# Patient Record
Sex: Male | Born: 1945 | Race: White | Hispanic: No | Marital: Married | Smoking: Former smoker
Health system: Southern US, Community
[De-identification: ages and names within clinical notes are randomized; demographics above are authoritative.]

---

## 2018-09-19 ENCOUNTER — Other Ambulatory Visit: Payer: Self-pay

## 2018-09-19 ENCOUNTER — Inpatient Hospital Stay (HOSPITAL_COMMUNITY)
Admission: AD | Admit: 2018-09-19 | Discharge: 2018-09-25 | DRG: 603 | Disposition: A | Discharge status: Home Health | Payer: Medicare Other | Source: Other Acute Inpatient Hospital | Attending: Internal Medicine | Admitting: Internal Medicine

## 2018-09-19 DIAGNOSIS — L03115 Cellulitis of right lower limb: Secondary | ICD-10-CM | POA: Diagnosis present

## 2018-09-19 DIAGNOSIS — F419 Anxiety disorder, unspecified: Secondary | ICD-10-CM | POA: Diagnosis present

## 2018-09-19 DIAGNOSIS — E876 Hypokalemia: Secondary | ICD-10-CM | POA: Diagnosis not present

## 2018-09-19 DIAGNOSIS — J449 Chronic obstructive pulmonary disease, unspecified: Secondary | ICD-10-CM | POA: Diagnosis present

## 2018-09-19 DIAGNOSIS — F329 Major depressive disorder, single episode, unspecified: Secondary | ICD-10-CM | POA: Diagnosis present

## 2018-09-19 DIAGNOSIS — D631 Anemia in chronic kidney disease: Secondary | ICD-10-CM | POA: Diagnosis present

## 2018-09-19 DIAGNOSIS — I10 Essential (primary) hypertension: Secondary | ICD-10-CM | POA: Diagnosis present

## 2018-09-19 DIAGNOSIS — R0602 Shortness of breath: Secondary | ICD-10-CM

## 2018-09-19 DIAGNOSIS — I482 Chronic atrial fibrillation, unspecified: Secondary | ICD-10-CM | POA: Diagnosis present

## 2018-09-19 DIAGNOSIS — R52 Pain, unspecified: Secondary | ICD-10-CM | POA: Diagnosis not present

## 2018-09-19 DIAGNOSIS — E1122 Type 2 diabetes mellitus with diabetic chronic kidney disease: Secondary | ICD-10-CM | POA: Diagnosis present

## 2018-09-19 DIAGNOSIS — Z87891 Personal history of nicotine dependence: Secondary | ICD-10-CM | POA: Diagnosis not present

## 2018-09-19 DIAGNOSIS — M25571 Pain in right ankle and joints of right foot: Secondary | ICD-10-CM

## 2018-09-19 DIAGNOSIS — Z7901 Long term (current) use of anticoagulants: Secondary | ICD-10-CM

## 2018-09-19 DIAGNOSIS — K219 Gastro-esophageal reflux disease without esophagitis: Secondary | ICD-10-CM | POA: Diagnosis present

## 2018-09-19 DIAGNOSIS — E1169 Type 2 diabetes mellitus with other specified complication: Secondary | ICD-10-CM | POA: Diagnosis not present

## 2018-09-19 DIAGNOSIS — M109 Gout, unspecified: Secondary | ICD-10-CM | POA: Diagnosis present

## 2018-09-19 DIAGNOSIS — M7989 Other specified soft tissue disorders: Secondary | ICD-10-CM | POA: Diagnosis not present

## 2018-09-19 DIAGNOSIS — M25471 Effusion, right ankle: Secondary | ICD-10-CM | POA: Diagnosis not present

## 2018-09-19 DIAGNOSIS — E039 Hypothyroidism, unspecified: Secondary | ICD-10-CM | POA: Diagnosis present

## 2018-09-19 DIAGNOSIS — Z6834 Body mass index (BMI) 34.0-34.9, adult: Secondary | ICD-10-CM

## 2018-09-19 DIAGNOSIS — E1140 Type 2 diabetes mellitus with diabetic neuropathy, unspecified: Secondary | ICD-10-CM | POA: Diagnosis present

## 2018-09-19 DIAGNOSIS — M79604 Pain in right leg: Secondary | ICD-10-CM

## 2018-09-19 DIAGNOSIS — N179 Acute kidney failure, unspecified: Secondary | ICD-10-CM | POA: Diagnosis present

## 2018-09-19 DIAGNOSIS — I2510 Atherosclerotic heart disease of native coronary artery without angina pectoris: Secondary | ICD-10-CM | POA: Diagnosis present

## 2018-09-19 DIAGNOSIS — I129 Hypertensive chronic kidney disease with stage 1 through stage 4 chronic kidney disease, or unspecified chronic kidney disease: Secondary | ICD-10-CM | POA: Diagnosis present

## 2018-09-19 DIAGNOSIS — M25579 Pain in unspecified ankle and joints of unspecified foot: Secondary | ICD-10-CM

## 2018-09-19 DIAGNOSIS — N183 Chronic kidney disease, stage 3 unspecified: Secondary | ICD-10-CM | POA: Diagnosis present

## 2018-09-19 DIAGNOSIS — Z20828 Contact with and (suspected) exposure to other viral communicable diseases: Secondary | ICD-10-CM | POA: Diagnosis present

## 2018-09-19 DIAGNOSIS — E119 Type 2 diabetes mellitus without complications: Secondary | ICD-10-CM

## 2018-09-19 DIAGNOSIS — M79671 Pain in right foot: Secondary | ICD-10-CM | POA: Diagnosis present

## 2018-09-19 MED ORDER — SODIUM CHLORIDE 0.9 % IV SOLN
INTRAVENOUS | Status: DC
  Administered 2018-09-20 – 2018-09-22 (×3): via INTRAVENOUS

## 2018-09-19 MED ORDER — HEPARIN SODIUM (PORCINE) 5000 UNIT/ML IJ SOLN
5000.0000 [IU] | Freq: Three times a day (TID) | INTRAMUSCULAR | Status: DC
  Administered 2018-09-20 – 2018-09-21 (×4): 5000 [IU] via SUBCUTANEOUS
  Filled 2018-09-19 (×4): qty 1

## 2018-09-19 MED ORDER — MORPHINE SULFATE (PF) 2 MG/ML IV SOLN
2.0000 mg | INTRAVENOUS | Status: DC | PRN
  Administered 2018-09-20 – 2018-09-22 (×6): 2 mg via INTRAVENOUS
  Filled 2018-09-19 (×6): qty 1

## 2018-09-19 MED ORDER — ONDANSETRON HCL 4 MG PO TABS: 4.0000 mg | ORAL_TABLET | Freq: Four times a day (QID) | ORAL | Status: DC | PRN

## 2018-09-19 MED ORDER — PIPERACILLIN-TAZOBACTAM 3.375 G IVPB 30 MIN
3.3750 g | Freq: Once | INTRAVENOUS | Status: AC
  Administered 2018-09-20: 3.375 g via INTRAVENOUS
  Filled 2018-09-19: qty 50

## 2018-09-19 MED ORDER — ONDANSETRON HCL 4 MG/2ML IJ SOLN: 4.0000 mg | Freq: Four times a day (QID) | INTRAMUSCULAR | Status: DC | PRN

## 2018-09-19 NOTE — H&P (Signed)
History and Physical   Marc Owen FKC:[id number] DOB: [birth year] DOA: 09/19/2018  Referring MD/NP/PA: Benson Norway  PCP: Patient, No Pcp Per   Outpatient Specialists: None  Patient coming from: [facility] [place]  Chief Complaint: Right foot swelling and pain  HPI: Marc Owen is a 73 y.o. male with medical history significant of type 2 diabetes, COPD, atrial fibrillation, coronary artery disease, morbid obesity who presented to the ER in [place] with complaint of right ankle pain swelling and tenderness.  He was suspected to have cellulitis and possibly septic arthritis of the right ankle joint.  He was hemodynamically is stable except for significant tenderness swelling of lab changes showing white count of 11.1 elevated ESR as well as x-ray showing nose evidence of osteomyelitis.  Due to lack of orthopedic surgeon in the hospital patient was transferred here so orthopedics can evaluate patient.  Dr. [name] from [facility] has accepted to see the patient.  Patient denied any fever or chills at the moment.  He has been unable to put weight on his feet.  He has been taking his medications for blood sugar as well as hypertension.  He has been accepted and will be admitted for medical service..  ED Course: In the outside ED temperature was 99 blood pressure 140/90 pulse 94 respirate 20 oxygen sat 94% room air.  His white count is 11.1 ESR 58 creatinine 2.12, right ankle films showed erosions but no osteomyelitis.  Review of Systems: As per HPI otherwise 10 point review of systems negative.    History reviewed. No pertinent past medical history.  History reviewed. No pertinent surgical history.   has no history on file for tobacco, alcohol, and drug.  Not on File  History reviewed. No pertinent family history.   Prior to Admission medications   Not on File    Physical Exam: Vitals:   09/20/18 0041 09/20/18 0057  BP: (!) 135/98   Pulse: 66 76  Resp:  20  Temp: 98.2 F  (36.8 C)   TempSrc: Oral   SpO2:  99%  Weight: 99.3 kg   Height: '5\' 7"'  (1.702 m)       Constitutional: NAD, mild distress due to pain, morbidly obese comfortable Vitals:   09/20/18 0041 09/20/18 0057  BP: (!) 135/98   Pulse: 66 76  Resp:  20  Temp: 98.2 F (36.8 C)   TempSrc: Oral   SpO2:  99%  Weight: 99.3 kg   Height: '5\' 7"'  (1.702 m)    Eyes: PERRL, lids and conjunctivae normal ENMT: Mucous membranes are moist. Posterior pharynx clear of any exudate or lesions.Normal dentition.  Neck: normal, supple, no masses, no thyromegaly Respiratory: clear to auscultation bilaterally, no wheezing, no crackles. Normal respiratory effort. No accessory muscle use.  Cardiovascular: Regular rate and rhythm, no murmurs / rubs / gallops. No extremity edema. 2+ pedal pulses. No carotid bruits.  Abdomen: no tenderness, no masses palpated. No hepatosplenomegaly. Bowel sounds positive.  Musculoskeletal: Right lower extremity is swollen but more around the ankle joint, warm to touch and very tender, no color change, decreased range of motion, normal muscle tone.  Skin: no rashes, lesions, ulcers. No induration Neurologic: CN 2-12 grossly intact. Sensation intact, DTR normal. Strength 5/5 in all 4.  Psychiatric: Normal judgment and insight. Alert and oriented x 3. Normal mood.     Labs on Admission: I have personally reviewed following labs and imaging studies  CBC: Recent Labs  Lab 09/20/18 0024  WBC 10.6*  HGB 12.7*  HCT 39.0  MCV 96.3  PLT 962   Basic Metabolic Panel: Recent Labs  Lab 09/20/18 0024  NA 139  K 3.5  CL 105  CO2 28  GLUCOSE 134*  BUN 25*  CREATININE 2.20*  CALCIUM 9.5   GFR: Estimated Creatinine Clearance: 34.1 mL/min (A) (by C-G formula based on SCr of 2.2 mg/dL (H)). Liver Function Tests: Recent Labs  Lab 09/20/18 0024  AST 13*  ALT 14  ALKPHOS 97  BILITOT 0.8  PROT 7.4  ALBUMIN 3.6   No results for input(s): LIPASE, AMYLASE in the last 168 hours.  No results for input(s): AMMONIA in the last 168 hours. Coagulation Profile: No results for input(s): INR, PROTIME in the last 168 hours. Cardiac Enzymes: No results for input(s): CKTOTAL, CKMB, CKMBINDEX, TROPONINI in the last 168 hours. BNP (last 3 results) No results for input(s): PROBNP in the last 8760 hours. HbA1C: No results for input(s): HGBA1C in the last 72 hours. CBG: No results for input(s): GLUCAP in the last 168 hours. Lipid Profile: No results for input(s): CHOL, HDL, LDLCALC, TRIG, CHOLHDL, LDLDIRECT in the last 72 hours. Thyroid Function Tests: No results for input(s): TSH, T4TOTAL, FREET4, T3FREE, THYROIDAB in the last 72 hours. Anemia Panel: No results for input(s): VITAMINB12, FOLATE, FERRITIN, TIBC, IRON, RETICCTPCT in the last 72 hours. Urine analysis: No results found for: COLORURINE, APPEARANCEUR, LABSPEC, PHURINE, GLUCOSEU, HGBUR, BILIRUBINUR, KETONESUR, PROTEINUR, UROBILINOGEN, NITRITE, LEUKOCYTESUR Sepsis Labs: '@LABRCNTIP' (procalcitonin:4,lacticidven:4) )No results found for this or any previous visit (from the past 240 hour(s)).   Radiological Exams on Admission: Dg Ankle Complete Right  Result Date: 09/20/2018 CLINICAL DATA:  Cellulitis, pain, swelling EXAM: RIGHT ANKLE - COMPLETE 3+ VIEW COMPARISON:  None. FINDINGS: No acute bony abnormality. Specifically, no fracture, subluxation, or dislocation. Mild diffuse soft tissue swelling. No bone destruction to suggest osteomyelitis. IMPRESSION: No acute bony abnormality. Electronically Signed   By: Rolm Baptise M.D.   On: 09/20/2018 01:18   Dg Foot Complete Right  Result Date: 09/20/2018 CLINICAL DATA:  Cellulitis, pain, swelling EXAM: RIGHT FOOT COMPLETE - 3+ VIEW COMPARISON:  None. FINDINGS: No acute bony abnormality. Specifically, no fracture, subluxation, or dislocation. Soft tissue swelling along the dorsum of the foot. No bone destruction to suggest osteomyelitis IMPRESSION: No acute bony abnormality.  Electronically Signed   By: Rolm Baptise M.D.   On: 09/20/2018 01:17      Assessment/Plan Principal Problem:   Cellulitis of right ankle Active Problems:   DM (diabetes mellitus) (HCC)   Benign essential HTN   CAD (coronary artery disease)   COPD (chronic obstructive pulmonary disease) (HCC)   CKD (chronic kidney disease), stage III (HCC)   Chronic a-fib     #1 right ankle cellulitis: Suspected septic arthritis.  Orthopedics consulted.  Initiate vancomycin and Zosyn.  Pain management.  Elevate the foot.  Further imaging as recommended by orthopedics  #2 atrial fibrillation: Patient is on warfarin.  INR was 3.1 prior to arrival.  With expectation for possible joint aspiration we will hold warfarin.  Resume after any procedure.  #3 diabetes: Sliding scale insulin to be added to home regimen.  #4 hypertension: Continue blood pressure control.  #5 coronary artery disease: Patient appears to be at baseline.  Continue monitoring.  #6 chronic kidney disease stage III: BUN/creatinine appears stable.  #7 COPD: No exacerbation.  Continue with home regimen.   DVT prophylaxis: Heparin for now resume warfarin after procedure Code Status: Full code Family Communication: No family at bedside Disposition Plan:  To be determined Consults called: Dr. [name], orthopedic surgery Admission status: Inpatient  Severity of Illness: The appropriate patient status for this patient is INPATIENT. Inpatient status is judged to be reasonable and necessary in order to provide the required intensity of service to ensure the patient's safety. The patient's presenting symptoms, physical exam findings, and initial radiographic and laboratory data in the context of their chronic comorbidities is felt to place them at high risk for further clinical deterioration. Furthermore, it is not anticipated that the patient will be medically stable for discharge from the hospital within 2 midnights of admission. The  following factors support the patient status of inpatient.   " The patient's presenting symptoms include right ankle pain. " The worrisome physical exam findings include swollen tender warm right ankle. " The initial radiographic and laboratory data are worrisome because of x-ray showing no evidence of osteomyelitis. " The chronic co-morbidities include diabetes hypertension.   * I certify that at the point of admission it is my clinical judgment that the patient will require inpatient hospital care spanning beyond 2 midnights from the point of admission due to high intensity of service, high risk for further deterioration and high frequency of surveillance required.Barbette Merino MD [facility] Pager [phone]  If 7PM-7AM, please contact night-coverage [email or web address] Password [email or web address]  09/20/2018, 5:29 AM

## 2018-09-20 ENCOUNTER — Inpatient Hospital Stay (HOSPITAL_COMMUNITY): Payer: Medicare Other

## 2018-09-20 ENCOUNTER — Encounter (HOSPITAL_COMMUNITY): Payer: Self-pay | Admitting: Internal Medicine

## 2018-09-20 DIAGNOSIS — M7989 Other specified soft tissue disorders: Secondary | ICD-10-CM

## 2018-09-20 DIAGNOSIS — I10 Essential (primary) hypertension: Secondary | ICD-10-CM

## 2018-09-20 DIAGNOSIS — M25571 Pain in right ankle and joints of right foot: Secondary | ICD-10-CM

## 2018-09-20 DIAGNOSIS — M25471 Effusion, right ankle: Secondary | ICD-10-CM

## 2018-09-20 DIAGNOSIS — M79604 Pain in right leg: Secondary | ICD-10-CM

## 2018-09-20 DIAGNOSIS — J449 Chronic obstructive pulmonary disease, unspecified: Secondary | ICD-10-CM

## 2018-09-20 DIAGNOSIS — E1169 Type 2 diabetes mellitus with other specified complication: Secondary | ICD-10-CM

## 2018-09-20 DIAGNOSIS — I482 Chronic atrial fibrillation, unspecified: Secondary | ICD-10-CM

## 2018-09-20 DIAGNOSIS — N183 Chronic kidney disease, stage 3 (moderate): Secondary | ICD-10-CM

## 2018-09-20 DIAGNOSIS — R52 Pain, unspecified: Secondary | ICD-10-CM

## 2018-09-20 LAB — CBC
HCT: 39 % (ref 39.0–52.0)
Hemoglobin: 12.7 g/dL — ABNORMAL LOW (ref 13.0–17.0)
MCH: 31.4 pg (ref 26.0–34.0)
MCHC: 32.6 g/dL (ref 30.0–36.0)
MCV: 96.3 fL (ref 80.0–100.0)
Platelets: 244 10*3/uL (ref 150–400)
RBC: 4.05 MIL/uL — ABNORMAL LOW (ref 4.22–5.81)
RDW: 14.6 % (ref 11.5–15.5)
WBC: 10.6 10*3/uL — ABNORMAL HIGH (ref 4.0–10.5)
nRBC: 0 % (ref 0.0–0.2)

## 2018-09-20 LAB — GLUCOSE, CAPILLARY
Glucose-Capillary: 81 mg/dL (ref 70–99)
Glucose-Capillary: 72 mg/dL (ref 70–99)
Glucose-Capillary: 105 mg/dL — ABNORMAL HIGH (ref 70–99)
Glucose-Capillary: 110 mg/dL — ABNORMAL HIGH (ref 70–99)

## 2018-09-20 LAB — COMPREHENSIVE METABOLIC PANEL
ALT: 14 U/L (ref 0–44)
AST: 13 U/L — ABNORMAL LOW (ref 15–41)
Albumin: 3.6 g/dL (ref 3.5–5.0)
Alkaline Phosphatase: 97 U/L (ref 38–126)
Anion gap: 6 (ref 5–15)
BUN: 25 mg/dL — ABNORMAL HIGH (ref 8–23)
CO2: 28 mmol/L (ref 22–32)
Calcium: 9.5 mg/dL (ref 8.9–10.3)
Chloride: 105 mmol/L (ref 98–111)
Creatinine, Ser: 2.2 mg/dL — ABNORMAL HIGH (ref 0.61–1.24)
GFR calc Af Amer: 33 mL/min — ABNORMAL LOW (ref >60)
GFR calc non Af Amer: 29 mL/min — ABNORMAL LOW (ref >60)
Glucose, Bld: 134 mg/dL — ABNORMAL HIGH (ref 70–99)
Potassium: 3.5 mmol/L (ref 3.5–5.1)
Sodium: 139 mmol/L (ref 135–145)
Total Bilirubin: 0.8 mg/dL (ref 0.3–1.2)
Total Protein: 7.4 g/dL (ref 6.5–8.1)

## 2018-09-20 LAB — HEMOGLOBIN A1C
Hgb A1c MFr Bld: 6.8 % — ABNORMAL HIGH (ref 4.8–5.6)
Mean Plasma Glucose: 148.46 mg/dL

## 2018-09-20 LAB — SARS CORONAVIRUS 2 (TAT 6-24 HRS): SARS Coronavirus 2: NEGATIVE

## 2018-09-20 MED ORDER — PIPERACILLIN-TAZOBACTAM 3.375 G IVPB
3.3750 g | Freq: Three times a day (TID) | INTRAVENOUS | Status: DC
  Administered 2018-09-20: 3.375 g via INTRAVENOUS
  Filled 2018-09-20 (×2): qty 50

## 2018-09-20 MED ORDER — GABAPENTIN 300 MG PO CAPS
300.0000 mg | ORAL_CAPSULE | Freq: Two times a day (BID) | ORAL | Status: DC
  Administered 2018-09-20 – 2018-09-25 (×10): 300 mg via ORAL
  Filled 2018-09-20 (×10): qty 1

## 2018-09-20 MED ORDER — ALLOPURINOL 100 MG PO TABS
200.0000 mg | ORAL_TABLET | Freq: Two times a day (BID) | ORAL | Status: DC
  Administered 2018-09-20 – 2018-09-25 (×11): 200 mg via ORAL
  Filled 2018-09-20 (×11): qty 2

## 2018-09-20 MED ORDER — PRAVASTATIN SODIUM 40 MG PO TABS
40.0000 mg | ORAL_TABLET | Freq: Every day | ORAL | Status: DC
  Administered 2018-09-20 – 2018-09-24 (×5): 40 mg via ORAL
  Filled 2018-09-20 (×5): qty 1

## 2018-09-20 MED ORDER — MORPHINE SULFATE 15 MG PO TABS: 15.0000 mg | ORAL_TABLET | Freq: Two times a day (BID) | ORAL | Status: DC | PRN

## 2018-09-20 MED ORDER — LEVOTHYROXINE SODIUM 75 MCG PO TABS
175.0000 ug | ORAL_TABLET | Freq: Every day | ORAL | Status: DC
  Administered 2018-09-21 – 2018-09-25 (×5): 175 ug via ORAL
  Filled 2018-09-20 (×5): qty 1

## 2018-09-20 MED ORDER — FUROSEMIDE 40 MG PO TABS
40.0000 mg | ORAL_TABLET | Freq: Every day | ORAL | Status: DC
  Administered 2018-09-20 – 2018-09-22 (×3): 40 mg via ORAL
  Filled 2018-09-20 (×3): qty 1

## 2018-09-20 MED ORDER — ASPIRIN EC 81 MG PO TBEC
81.0000 mg | DELAYED_RELEASE_TABLET | Freq: Every day | ORAL | Status: DC
  Administered 2018-09-21 – 2018-09-25 (×5): 81 mg via ORAL
  Filled 2018-09-20 (×5): qty 1

## 2018-09-20 MED ORDER — VANCOMYCIN HCL 10 G IV SOLR
1750.0000 mg | INTRAVENOUS | Status: DC
  Administered 2018-09-20: 1750 mg via INTRAVENOUS
  Filled 2018-09-20: qty 1750

## 2018-09-20 MED ORDER — CINACALCET HCL 30 MG PO TABS
30.0000 mg | ORAL_TABLET | Freq: Two times a day (BID) | ORAL | Status: DC
  Administered 2018-09-20 – 2018-09-25 (×11): 30 mg via ORAL
  Filled 2018-09-20 (×11): qty 1

## 2018-09-20 MED ORDER — SODIUM CHLORIDE 0.9 % IV SOLN
2.0000 g | Freq: Two times a day (BID) | INTRAVENOUS | Status: DC
  Administered 2018-09-20 – 2018-09-21 (×2): 2 g via INTRAVENOUS
  Filled 2018-09-20 (×4): qty 2

## 2018-09-20 MED ORDER — VENLAFAXINE HCL ER 150 MG PO CP24
150.0000 mg | ORAL_CAPSULE | Freq: Every day | ORAL | Status: DC
  Administered 2018-09-21 – 2018-09-25 (×5): 150 mg via ORAL
  Filled 2018-09-20 (×6): qty 1

## 2018-09-20 MED ORDER — DILTIAZEM HCL ER COATED BEADS 120 MG PO CP24
120.0000 mg | ORAL_CAPSULE | Freq: Every day | ORAL | Status: DC
  Administered 2018-09-20 – 2018-09-25 (×6): 120 mg via ORAL
  Filled 2018-09-20 (×8): qty 1

## 2018-09-20 MED ORDER — METOPROLOL TARTRATE 100 MG PO TABS
100.0000 mg | ORAL_TABLET | Freq: Two times a day (BID) | ORAL | Status: DC
  Administered 2018-09-20 – 2018-09-25 (×11): 100 mg via ORAL
  Filled 2018-09-20 (×11): qty 1

## 2018-09-20 MED ORDER — INSULIN ASPART 100 UNIT/ML ~~LOC~~ SOLN
0.0000 [IU] | Freq: Three times a day (TID) | SUBCUTANEOUS | Status: DC
  Administered 2018-09-21: 15 [IU] via SUBCUTANEOUS

## 2018-09-20 MED ORDER — AMLODIPINE BESYLATE 5 MG PO TABS
5.0000 mg | ORAL_TABLET | Freq: Every day | ORAL | Status: DC
  Administered 2018-09-20 – 2018-09-25 (×6): 5 mg via ORAL
  Filled 2018-09-20 (×6): qty 1

## 2018-09-20 MED ORDER — PANTOPRAZOLE SODIUM 40 MG PO TBEC
40.0000 mg | DELAYED_RELEASE_TABLET | Freq: Every day | ORAL | Status: DC
  Administered 2018-09-20 – 2018-09-25 (×6): 40 mg via ORAL
  Filled 2018-09-20 (×6): qty 1

## 2018-09-20 MED ORDER — INSULIN ASPART 100 UNIT/ML ~~LOC~~ SOLN: 0.0000 [IU] | Freq: Every day | SUBCUTANEOUS | Status: DC

## 2018-09-20 NOTE — Consult Note (Signed)
Reason for Consult: Right ankle swelling and pain Referring Physician: [facility]  Marc Owen is an 73 y.o. male.  HPI: Patient states he has had progressive worsening pain in his right ankle and foot with associated swelling for the past couple of days.  Patient states he woke up in the morning with pain and swelling.  It seems to have worsened somewhat.  He is unable to bear weight well on his ankle.  He denies any injury that he can recall.  He denies any fevers or chills.  Patient states he had a hip replacement on the left side by the [facility] hospital but otherwise has done well.  Patient has a history of diabetes and takes Coumadin for A. fib and coronary artery disease.  He also has COPD.  At the emergency department his ESR was slightly elevated as was his white count.  There was concern for septic ankle and he was transferred to [facility] for evaluation by orthopedics.  History reviewed. No pertinent past medical history.  History reviewed. No pertinent surgical history.  History reviewed. No pertinent family history.  Social History:  reports that he has quit smoking. He has never used smokeless tobacco. No history on file for alcohol and drug.  Allergies: Not on File  Medications: I have reviewed the patient's current medications.  Results for orders placed or performed during the hospital encounter of 09/19/18 (from the past 48 hour(s))  SARS CORONAVIRUS 2 Nasal Swab Aptima Multi Swab     Status: None   Collection Time: 09/19/18 11:28 PM   Specimen: Aptima Multi Swab; Nasal Swab  Result Value Ref Range   SARS Coronavirus 2 NEGATIVE NEGATIVE    Comment: (NOTE) SARS-CoV-2 target nucleic acids are NOT DETECTED. The SARS-CoV-2 RNA is generally detectable in upper and lower respiratory specimens during the acute phase of infection. Negative results do not preclude SARS-CoV-2 infection, do not rule out co-infections with other pathogens, and should not be used as the sole  basis for treatment or other patient management decisions. Negative results must be combined with clinical observations, patient history, and epidemiological information. The expected result is Negative. Fact Sheet for Patients: SugarRoll.be Fact Sheet for Healthcare Providers: https://www.woods-mathews.com/ This test is not yet approved or cleared by the [place] FDA and  has been authorized for detection and/or diagnosis of SARS-CoV-2 by FDA under an Emergency Use Authorization (EUA). This EUA will remain  in effect (meaning this test can be used) for the duration of the COVID-19 declaration under Section 56 4(b)(1) of the Act, 21 U.S.C. section 360bbb-3(b)(1), unless the authorization is terminated or revoked sooner. Performed at [facility], [place] [address] [place],  [address]   Comprehensive metabolic panel     Status: Abnormal   Collection Time: 09/20/18 12:24 AM  Result Value Ref Range   Sodium 139 135 - 145 mmol/L   Potassium 3.5 3.5 - 5.1 mmol/L   Chloride 105 98 - 111 mmol/L   CO2 28 22 - 32 mmol/L   Glucose, Bld 134 (H) 70 - 99 mg/dL   BUN 25 (H) 8 - 23 mg/dL   Creatinine, Ser 2.20 (H) 0.61 - 1.24 mg/dL   Calcium 9.5 8.9 - 10.3 mg/dL   Total Protein 7.4 6.5 - 8.1 g/dL   Albumin 3.6 3.5 - 5.0 g/dL   AST 13 (L) 15 - 41 U/L   ALT 14 0 - 44 U/L   Alkaline Phosphatase 97 38 - 126 U/L  Total Bilirubin 0.8 0.3 - 1.2 mg/dL   GFR calc non Af Amer 29 (L) >60 mL/min   GFR calc Af Amer 33 (L) >60 mL/min   Anion gap 6 5 - 15    Comment: Performed at [facility] [address] [place], [place] [address]  CBC     Status: Abnormal   Collection Time: 09/20/18 12:24 AM  Result Value Ref Range   WBC 10.6 (H) 4.0 - 10.5 K/uL   RBC 4.05 (L) 4.22 - 5.81 MIL/uL   Hemoglobin 12.7 (L) 13.0 - 17.0 g/dL   HCT 39.0 39.0 - 52.0 %   MCV 96.3 80.0 - 100.0 fL   MCH 31.4 26.0 - 34.0 pg   MCHC 32.6 30.0 - 36.0 g/dL   RDW  14.6 11.5 - 15.5 %   Platelets 244 150 - 400 K/uL   nRBC 0.0 0.0 - 0.2 %    Comment: Performed at [facility], [place] [address] [place], [place] [address]  Hemoglobin A1c     Status: Abnormal   Collection Time: 09/20/18 12:24 AM  Result Value Ref Range   Hgb A1c MFr Bld 6.8 (H) 4.8 - 5.6 %    Comment: (NOTE) Pre diabetes:          5.7%-6.4% Diabetes:              >6.4% Glycemic control for   <7.0% adults with diabetes    Mean Plasma Glucose 148.46 mg/dL    Comment: Performed at [facility] [address], [place], [place] [address]  Glucose, capillary     Status: None   Collection Time: 09/20/18  7:26 AM  Result Value Ref Range   Glucose-Capillary 81 70 - 99 mg/dL    Dg Ankle Complete Right  Result Date: 09/20/2018 CLINICAL DATA:  Cellulitis, pain, swelling EXAM: RIGHT ANKLE - COMPLETE 3+ VIEW COMPARISON:  None. FINDINGS: No acute bony abnormality. Specifically, no fracture, subluxation, or dislocation. Mild diffuse soft tissue swelling. No bone destruction to suggest osteomyelitis. IMPRESSION: No acute bony abnormality. Electronically Signed   By: Rolm Baptise M.D.   On: 09/20/2018 01:18   Dg Foot Complete Right  Result Date: 09/20/2018 CLINICAL DATA:  Cellulitis, pain, swelling EXAM: RIGHT FOOT COMPLETE - 3+ VIEW COMPARISON:  None. FINDINGS: No acute bony abnormality. Specifically, no fracture, subluxation, or dislocation. Soft tissue swelling along the dorsum of the foot. No bone destruction to suggest osteomyelitis IMPRESSION: No acute bony abnormality. Electronically Signed   By: Rolm Baptise M.D.   On: 09/20/2018 01:17    Review of Systems  Constitutional: Negative.   HENT: Negative.   Eyes: Negative.   Respiratory: Negative.   Cardiovascular: Negative.   Gastrointestinal: Negative.   Musculoskeletal:       Right ankle, foot and leg pain and swelling.  Skin: Negative.   Neurological: Negative.   Psychiatric/Behavioral: Negative.    Blood pressure (!)  154/104, pulse 90, temperature 98.4 F (36.9 C), temperature source Oral, resp. rate 18, height '5\' 7"'  (1.702 m), weight 99.3 kg, SpO2 94 %. Physical Exam  Constitutional: He appears well-developed.  Eyes: Conjunctivae are normal.  Neck: Neck supple.  Cardiovascular: Normal rate.  HTN  Respiratory: Effort normal.  Nasal canula  GI: Soft.  Musculoskeletal:     Comments: Evaluation of the right ankle demonstrates swelling of the ankle and foot.  He has tenderness palpation laterally about the ankle dorsally about the foot.  He has pain with  ankle and foot motion.  He has tenderness proximal to the ankle about the leg.  Swelling extends to the distal third leg.  He has pain with any motion about the foot or ankle.  Foot is warm and well-perfused.  Unable to palpate pulses due to swelling.  No erythema.  No skin breakdown or wounds noted.  Patient is able to range bilateral upper extremities without difficulty.  No evidence of injury.  No evidence of left lower extremity injury or swelling.  Some skin changes about the distal left leg consistent with venous insufficiency.  Neurological: He is alert.  Skin: Skin is warm.  Psychiatric: He has a normal mood and affect.    Assessment/Plan: His ankle is certainly swollen and tender to palpation and painful with range of motion but does not appear to be infected.  There is no erythema.  The swelling is globally about the foot, ankle and distal leg.  This is more concerning for an underlying injury.  His x-rays, although read by the radiologist as normal, shows some changes within the distal fibula as well as in the third metatarsal base.  My recommendation would be for an MRI scan of these areas to look for insufficiency fracture versus other causes for the erosions and cystic appearing changes of the bone.  At this point we will not aspirate the ankle and await MRI results.  Will update plan when MRI is completed.  For now he will be nonweightbearing on  the right lower extremity.  Erle Crocker 09/20/2018, 7:59 AM

## 2018-09-20 NOTE — Progress Notes (Signed)
°PROGRESS NOTE ° ° ° °Marc Owen  MRN:[id number] DOB: 09/14/1945 DOA: 09/19/2018 °PCP: Patient, No Pcp Per  ° °Brief Narrative:  °HPI per Dr. Mohammad Garba on 09/19/2018 °Marc Owen is a 73 y.o. male with medical history significant of type 2 diabetes, COPD, atrial fibrillation, coronary artery disease, morbid obesity who presented to the ER in [place] with complaint of right ankle pain swelling and tenderness.  He was suspected to have cellulitis and possibly septic arthritis of the right ankle joint.  He was hemodynamically is stable except for significant tenderness swelling of lab changes showing white count of 11.1 elevated ESR as well as x-ray showing nose evidence of osteomyelitis.  Due to lack of orthopedic surgeon in the hospital patient was transferred here so orthopedics can evaluate patient.  Dr. Adair from [facility] has accepted to see the patient.  Patient denied any fever or chills at the moment.  He has been unable to put weight on his feet.  He has been taking his medications for blood sugar as well as hypertension.  He has been accepted and will be admitted for medical service.. °  °ED Course: In the outside ED temperature was 99 blood pressure 140/90 pulse 94 respirate 20 oxygen sat 94% room air.  His white count is 11.1 ESR 58 creatinine 2.12, right ankle films showed erosions but no osteomyelitis. ° °**Interim history °Orthopedic surgery evaluated and do not feel that the patient's foot is infected given no erythema but they do feel that something underlying may be going on so they recommend an MRI of the foot to look for insufficiency fracture versus other causes for erosions and cystic appearing changes of the bone.  At this point orthopedics do not feel the need to aspirate the ankle there currently await MRI results and update the plan when MRI is completed.  Right now they recommending nonweightbearing on the right leg extremity.  MRI done shows a large ankle joint effusion  mild degenerative changes and no tibiotalar erosive destructive joint changes or marrow edema to suggest septic arthritis or inflammatory arthropathy there is also diffuse and fairly marked subcutaneous soft tissue swelling and edema and fluid of unclear etiology which could reflect a cellulitis which make the ankle joint effusion somewhat more worrisome there is no discrete drainable soft tissue abscess noted.  There is no stress fractures or AVN. ° °Assessment & Plan: °  °Principal Problem: °  Cellulitis of right ankle °Active Problems: °  DM (diabetes mellitus) (HCC) °  Benign essential HTN °  CAD (coronary artery disease) °  COPD (chronic obstructive pulmonary disease) (HCC) °  CKD (chronic kidney disease), stage III (HCC) °  Chronic a-fib °  Pain and swelling of ankle, right °  Pain and swelling of lower extremity, right ° °Right foot pain with concern for underlying infection with concern for cellulitis °-Suspected septic arthritis initially but foot is not warm or erythematous so orthopedics feels like this is less likely infection.  °-Orthopedics Dr. Adair consulted for further evaluation recommendation.   °-He was initially initiated on Vancomycin and Zosyn; see changes below °-Pain management.   °-Continue to elevate the foot and orthopedics recommending nonweightbearing.   °-Further imaging as recommended by orthopedics and they obtain an MRI of the ankle and foot °-MRI of the ankle and foot showed "Large ankle joint effusion and mild degenerative changes. No tibiotalar erosions or destructive bony changes or marrow edema to suggest septic arthritis or inflammatory arthropathy. Cystic lesion in the base of   the third metatarsal has nonaggressive MR and plain film features is likely a benign bone cyst or intraosseous ganglion. No stress fractures or AVN.  Diffuse and fairly marked subcutaneous soft tissue °swelling/edema/fluid of uncertain etiology. It could reflect cellulitis which would make the ankle  joint effusions somewhat more worrisome. No discrete drainable soft tissue abscess. Mild diffuse myositis." °-Continue antibiotics for now but will likely change Zosyn to IV cefepime given concern for nephrotoxicity with vancomycin °-WBC on admission was 10.6 °-Further care per orthopedic surgery and they are recommending not aspirating the joint currently until MRI is resulted °-Continue with IV fluid gentle hydration at 50 mL's per hour; continue with further supportive care with pain control with 2 mg of IV morphine every 3 hours PRN for severe pain as well as antiemetics with Zofran 4 mg p.o./IV every 6 as needed nausea °-He had some leg swelling so we obtained venous duplex to rule out DVT component of the swelling °  °Atrial Fibrillation °-Patient is on warfarin.   °-INR was 3.1 prior to arrival.  Will need to repeat PT/INR in a.m. and consult pharmacy for Coumadin adjustment   °-With expectation for possible joint aspiration we will hold warfarin.   °-We will resume after any procedure. °  °Diabetes Mellitus Type II °-Patient is hemoglobin A1c was 6.8 on admission °-CBGs have been ranging from 72-81 °-Continue with Resistant sliding scale insulin before meals and at bedtime °-Continue monitor and trend CBGs and adjust insulin as necessary °  °Hypertension °-Continue blood pressure control when pharmacy verifies his medications °-Blood pressure this morning was 145/90 and prior to that was 154/104 likely in the setting of pain °  °Coronary Artery disease °-Patient appears to be at baseline and not complaining of any chest pain. -Continue monitoring and resume home medications once verified by pharmacy °  °AKI on Chronic kidney disease stage III °-BUN/creatinine was 25/2.20 on admission °-Continue gentle IV fluid hydration with normal saline rate of 50 mL's per hour °-Avoid nephrotoxic medications, contrast dyes, as well as hypertension -Continue monitor and trend renal function °-Repeat CMP in  a.m. °  °COPD °-Currently not in Exacerbation °-Continue with home regimen when Medications are verified by Pharmacy ° °Normocytic Anemia °-Patient's hemoglobin/hematocrit on admission was 12.7/31.0 °-Check anemia panel in the a.m. °-Continue to monitor for signs and symptoms of bleeding; currently no overt bleeding noted °-Repeat CBC in the a.m. ° °Obesity °-Estimated body mass index is 34.29 kg/m² as calculated from the following: °  Height as of this encounter: 5' 7" (1.702 m). °  Weight as of this encounter: 99.3 kg. °-Weight Loss and Dietary Counseling given  ° °DVT prophylaxis: Heparin 5000 units subcu every 8h °Code Status: FULL CODE °Family Communication: No Family present at bedside  °Disposition Plan:  ° °Consultants:  °· Orthopedic Surgery Dr. Adair  ° °· Procedures: MRI of the Ankle and Foot ° °Antimicrobials:  °Anti-infectives (From admission, onward)  ° Start     Dose/Rate Route Frequency Ordered Stop  ° 09/20/18 1800  ceFEPIme (MAXIPIME) 2 g in sodium chloride 0.9 % 100 mL IVPB    ° 2 g °200 mL/hr over 30 Minutes Intravenous Every 12 hours 09/20/18 1322    ° 09/20/18 0800  piperacillin-tazobactam (ZOSYN) IVPB 3.375 g  Status:  Discontinued    ° 3.375 g °12.5 mL/hr over 240 Minutes Intravenous Every 8 hours 09/20/18 0131 09/20/18 1317  ° 09/20/18 0145  vancomycin (VANCOCIN) 1,750 mg in sodium chloride 0.9 % 500 mL IVPB    ° 1,750   mg °250 mL/hr over 120 Minutes Intravenous Every 48 hours 09/20/18 0131    ° 09/19/18 2300  piperacillin-tazobactam (ZOSYN) IVPB 3.375 g    ° 3.375 g °100 mL/hr over 30 Minutes Intravenous  Once 09/19/18 2226 09/20/18 0046  °  ° °Subjective: °Seen and examined at bedside and was slightly ill-appearing and complained of significant pain in the foot.  Patient states that any light touch to the foot causes significant amount of pain.  He states he has been swelling for the last 3 days.  He woke up in the morning with pain and swelling and had seemed to worsen.  No nausea or  vomiting but does not feel well.  No other concerns requested this time. ° °Objective: °Vitals:  ° 09/20/18 0041 09/20/18 0057 09/20/18 0556 09/20/18 0855  °BP: (!) 135/98  (!) 154/104 (!) 145/90  °Pulse: 66 76 90 88  °Resp:  20 18 18  °Temp: 98.2 °F (36.8 °C)  98.4 °F (36.9 °C) 98 °F (36.7 °C)  °TempSrc: Oral  Oral Oral  °SpO2:  99% 94% 96%  °Weight: 99.3 kg     °Height: 5' 7" (1.702 m)     ° ° °Intake/Output Summary (Last 24 hours) at 09/20/2018 1333 °Last data filed at 09/20/2018 1157 °Gross per 24 hour  °Intake 966.04 ml  °Output 350 ml  °Net 616.04 ml  ° °Filed Weights  ° 09/20/18 0041  °Weight: 99.3 kg  ° °Examination: °Physical Exam: ° °Constitutional: WN/WD obese Caucasian male in significant amount of pain in the right foot °Eyes: Lids and conjunctivae normal, sclerae anicteric  °ENMT: External Ears, Nose appear normal. Grossly normal hearing. Mucous membranes are moist.  °Neck: Appears normal, supple, no cervical masses, normal ROM, no appreciable thyromegaly; no JVD °Respiratory: Diminished to auscultation bilaterally, no wheezing, rales, rhonchi or crackles. Normal respiratory effort and patient is not tachypenic. No accessory muscle use.  °Cardiovascular: RRR, no murmurs / rubs / gallops. S1 and S2 auscultated. 1-2+ LE extremity edema more so on the Right compared to the Left. °Abdomen: Soft, non-tender, Distended due to body habitus. No masses palpated. No appreciable hepatosplenomegaly. Bowel sounds positive x4.  °GU: Deferred. °Musculoskeletal: Right foot is extremely swollen along with painful palpation and leg swelling °Skin: No rashes, lesions, ulcers limited skin evaluation. No induration; Warm and dry.  °Neurologic: CN 2-12 grossly intact with no focal deficits.  Romberg sign and cerebellar reflexes not assessed.  °Psychiatric: Normal judgment and insight. Alert and oriented x 3.  Anxious appearing mood and appropriate affect.  ° °Data Reviewed: I have personally reviewed following labs and imaging  studies ° °CBC: °Recent Labs  °Lab 09/20/18 °0024  °WBC 10.6*  °HGB 12.7*  °HCT 39.0  °MCV 96.3  °PLT 244  ° °Basic Metabolic Panel: °Recent Labs  °Lab 09/20/18 °0024  °NA 139  °K 3.5  °CL 105  °CO2 28  °GLUCOSE 134*  °BUN 25*  °CREATININE 2.20*  °CALCIUM 9.5  ° °GFR: °Estimated Creatinine Clearance: 34.1 mL/min (A) (by C-G formula based on SCr of 2.2 mg/dL (H)). °Liver Function Tests: °Recent Labs  °Lab 09/20/18 °0024  °AST 13*  °ALT 14  °ALKPHOS 97  °BILITOT 0.8  °PROT 7.4  °ALBUMIN 3.6  ° °No results for input(s): LIPASE, AMYLASE in the last 168 hours. °No results for input(s): AMMONIA in the last 168 hours. °Coagulation Profile: °No results for input(s): INR, PROTIME in the last 168 hours. °Cardiac Enzymes: °No results for input(s): CKTOTAL, CKMB, CKMBINDEX, TROPONINI in   the last 168 hours. °BNP (last 3 results) °No results for input(s): PROBNP in the last 8760 hours. °HbA1C: °Recent Labs  °  09/20/18 °0024  °HGBA1C 6.8*  ° °CBG: °Recent Labs  °Lab 09/20/18 °0726 09/20/18 °1127  °GLUCAP 81 72  ° °Lipid Profile: °No results for input(s): CHOL, HDL, LDLCALC, TRIG, CHOLHDL, LDLDIRECT in the last 72 hours. °Thyroid Function Tests: °No results for input(s): TSH, T4TOTAL, FREET4, T3FREE, THYROIDAB in the last 72 hours. °Anemia Panel: °No results for input(s): VITAMINB12, FOLATE, FERRITIN, TIBC, IRON, RETICCTPCT in the last 72 hours. °Sepsis Labs: °No results for input(s): PROCALCITON, LATICACIDVEN in the last 168 hours. ° °Recent Results (from the past 240 hour(s))  °SARS CORONAVIRUS 2 Nasal Swab Aptima Multi Swab     Status: None  ° Collection Time: 09/19/18 11:28 PM  ° Specimen: Aptima Multi Swab; Nasal Swab  °Result Value Ref Range Status  ° SARS Coronavirus 2 NEGATIVE NEGATIVE Final  °  Comment: (NOTE) °SARS-CoV-2 target nucleic acids are NOT DETECTED. °The SARS-CoV-2 RNA is generally detectable in upper and lower °respiratory specimens during the acute phase of infection. Negative °results do not preclude  SARS-CoV-2 infection, do not rule out °co-infections with other pathogens, and should not be used as the °sole basis for treatment or other patient management decisions. °Negative results must be combined with clinical observations, °patient history, and epidemiological information. The expected °result is Negative. °Fact Sheet for Patients: °https://www.fda.gov/media/138098/download °Fact Sheet for Healthcare Providers: °https://www.fda.gov/media/138095/download °This test is not yet approved or cleared by the [place] FDA and  °has been authorized for detection and/or diagnosis of SARS-CoV-2 by °FDA under an Emergency Use Authorization (EUA). This EUA will remain  °in effect (meaning this test can be used) for the duration of the °COVID-19 declaration under Section 56 °4(b)(1) of the Act, 21 U.S.C. °section 360bbb-3(b)(1), unless the authorization is terminated or °revoked sooner. °Performed at [facility], [address] [place], [place] °[address] °  °  °Radiology Studies: °Dg Ankle Complete Right ° °Result Date: 09/20/2018 °CLINICAL DATA:  Cellulitis, pain, swelling EXAM: RIGHT ANKLE - COMPLETE 3+ VIEW COMPARISON:  None. FINDINGS: No acute bony abnormality. Specifically, no fracture, subluxation, or dislocation. Mild diffuse soft tissue swelling. No bone destruction to suggest osteomyelitis. IMPRESSION: No acute bony abnormality. Electronically Signed   By: Kevin  Dover M.D.   On: 09/20/2018 01:18  ° °Mr Foot Right Wo Contrast ° °Result Date: 09/20/2018 °CLINICAL DATA:  Diffuse foot and ankle pain.  Abnormal foot films. EXAM: MRI OF THE RIGHT ANKLE WITHOUT CONTRAST; MRI OF THE RIGHT FOREFOOT WITHOUT CONTRAST TECHNIQUE: Multiplanar, multisequence MR imaging of the ankle was performed. No intravenous contrast was administered. COMPARISON:  Radiographs 09/19/2018 and 09/20/2018. FINDINGS: Examination is quite limited due to patient motion. TENDONS Peroneal: Intact Posteromedial: Intact Anterior: Intact  Achilles: Intact Plantar Fascia: Intact LIGAMENTS Lateral: Intact Medial: Intact CARTILAGE Ankle Joint: There is a large ankle joint effusion. Mild to moderate degenerative chondrosis with joint space narrowing but no full-thickness cartilage defect or osteochondral lesion. Cystic lucency noted in the distal fibula appears fairly well corticated and there is no surrounding edema like inflammatory changes. This could be a large subchondral cyst or intraosseous ganglion. Do not see any definite erosions involving the tibia or talus. Subtalar Joints/Sinus Tarsi: Mild subtalar joint degenerative changes and small joint effusions. Mild inflammation/edema in the sinus tarsi but the cervical and interosseous ligaments appear intact. Bones: As demonstrated on the radiographs there is cystic change in the   third metatarsal base. This appears to be a multi septated cystic lesion, possibly a benign bone cyst or intraosseous ganglion. I do not see any surrounding edema or inflammation to suggest a pathologic fracture or aggressive lesion. No metatarsal stress fractures. Other: Diffuse and fairly significant subcutaneous soft tissue swelling/edema/fluid which is nonspecific but certainly could reflect cellulitis. There is also nonspecific edema in the foot musculature suggesting myositis. No discrete drainable soft tissue abscess is identified. IMPRESSION: 1. Large ankle joint effusion and mild degenerative changes. No tibiotalar erosions or destructive bony changes or marrow edema to suggest septic arthritis or inflammatory arthropathy. 2. Cystic lesion in the base of the third metatarsal has nonaggressive MR and plain film features is likely a benign bone cyst or intraosseous ganglion. 3. No stress fractures or AVN. 4. Diffuse and fairly marked subcutaneous soft tissue swelling/edema/fluid of uncertain etiology. It could reflect cellulitis which would make the ankle joint effusions somewhat more worrisome. No discrete drainable  soft tissue abscess. Mild diffuse myositis. Electronically Signed   By: P.  Gallerani M.D.   On: 09/20/2018 11:16  ° °Mr Ankle Right Wo Contrast ° °Result Date: 09/20/2018 °CLINICAL DATA:  Diffuse foot and ankle pain.  Abnormal foot films. EXAM: MRI OF THE RIGHT ANKLE WITHOUT CONTRAST; MRI OF THE RIGHT FOREFOOT WITHOUT CONTRAST TECHNIQUE: Multiplanar, multisequence MR imaging of the ankle was performed. No intravenous contrast was administered. COMPARISON:  Radiographs 09/19/2018 and 09/20/2018. FINDINGS: Examination is quite limited due to patient motion. TENDONS Peroneal: Intact Posteromedial: Intact Anterior: Intact Achilles: Intact Plantar Fascia: Intact LIGAMENTS Lateral: Intact Medial: Intact CARTILAGE Ankle Joint: There is a large ankle joint effusion. Mild to moderate degenerative chondrosis with joint space narrowing but no full-thickness cartilage defect or osteochondral lesion. Cystic lucency noted in the distal fibula appears fairly well corticated and there is no surrounding edema like inflammatory changes. This could be a large subchondral cyst or intraosseous ganglion. Do not see any definite erosions involving the tibia or talus. Subtalar Joints/Sinus Tarsi: Mild subtalar joint degenerative changes and small joint effusions. Mild inflammation/edema in the sinus tarsi but the cervical and interosseous ligaments appear intact. Bones: As demonstrated on the radiographs there is cystic change in the third metatarsal base. This appears to be a multi septated cystic lesion, possibly a benign bone cyst or intraosseous ganglion. I do not see any surrounding edema or inflammation to suggest a pathologic fracture or aggressive lesion. No metatarsal stress fractures. Other: Diffuse and fairly significant subcutaneous soft tissue swelling/edema/fluid which is nonspecific but certainly could reflect cellulitis. There is also nonspecific edema in the foot musculature suggesting myositis. No discrete drainable soft  tissue abscess is identified. IMPRESSION: 1. Large ankle joint effusion and mild degenerative changes. No tibiotalar erosions or destructive bony changes or marrow edema to suggest septic arthritis or inflammatory arthropathy. 2. Cystic lesion in the base of the third metatarsal has nonaggressive MR and plain film features is likely a benign bone cyst or intraosseous ganglion. 3. No stress fractures or AVN. 4. Diffuse and fairly marked subcutaneous soft tissue swelling/edema/fluid of uncertain etiology. It could reflect cellulitis which would make the ankle joint effusions somewhat more worrisome. No discrete drainable soft tissue abscess. Mild diffuse myositis. Electronically Signed   By: P.  Gallerani M.D.   On: 09/20/2018 11:16  ° °Dg Foot Complete Right ° °Result Date: 09/20/2018 °CLINICAL DATA:  Cellulitis, pain, swelling EXAM: RIGHT FOOT COMPLETE - 3+ VIEW COMPARISON:  None. FINDINGS: No acute bony abnormality. Specifically, no fracture, subluxation, or dislocation.   Soft tissue swelling along the dorsum of the foot. No bone destruction to suggest osteomyelitis IMPRESSION: No acute bony abnormality. Electronically Signed   By: Kevin  Dover M.D.   On: 09/20/2018 01:17  ° °Scheduled Meds: °• heparin  5,000 Units Subcutaneous Q8H  °• insulin aspart  0-20 Units Subcutaneous TID WC  °• insulin aspart  0-5 Units Subcutaneous QHS  ° °Continuous Infusions: °• sodium chloride 50 mL/hr at 09/20/18 0014  °• ceFEPime (MAXIPIME) IV    °• vancomycin 1,750 mg (09/20/18 0235)  ° ° LOS: 1 day  ° °Omair Latif Sheikh, DO °[facility] °PAGER is on AMION ° °If 7PM-7AM, please contact night-coverage [email or web address] °Password [email or web address] °09/20/2018, 1:33 PM  °

## 2018-09-20 NOTE — Progress Notes (Signed)
New Admission Note: ? Arrival Method: transfer from [place] [facility] Orientation: A/O x 4 Telemetry: N/A Assessment: Completed Skin: Refer to flowsheet IV: Left AC placed prior to admission at [facility] Pain: 6/10 Tubes: Safety Measures: Safety Fall Prevention Plan discussed with patient. Admission: Completed [facility] [place] Orientation: Patient has been orientated to the room, unit and the staff. Family: Orders have been reviewed and are being implemented. Will continue to monitor the patient. Call light has been placed within reach and bed alarm has been activated.  ? [employer], [place]

## 2018-09-20 NOTE — Plan of Care (Signed)
  Problem: Nutrition: Goal: Adequate nutrition will be maintained Outcome: Progressing   

## 2018-09-20 NOTE — Progress Notes (Addendum)
Pharmacy Antibiotic Note  Marc Owen is a 73 y.o. male admitted on 09/19/2018 with cellulitis.  Pharmacy has been consulted for zosyn and vancomycin dosing.  Plan: Zosyn 3.375gm IV q8h (EI) Vancomycin 1750mg  IV q48h Expected AUC 437 Scr used 2.2 mg/dl Monitor C&S, clinical course, LOT, and deescalation Monitor renal function  Height: 5\' 7"  (170.2 cm) Weight: 218 lb 14.7 oz (99.3 kg) IBW/kg (Calculated) : 66.1  Temp (24hrs), Avg:98.2 F (36.8 C), Min:98.2 F (36.8 C), Max:98.2 F (36.8 C)  Recent Labs  Lab 09/20/18 0024  WBC 10.6*  CREATININE 2.20*    Estimated Creatinine Clearance: 34.1 mL/min (A) (by C-G formula based on SCr of 2.2 mg/dL (H)).    Not on File  Antimicrobials this admission: 8/2 zosyn >>  8/2 vancomycin >>     Dwayne A. Levada Dy, PharmD, BCPS, FNKF Clinical Pharmacist [place] Please utilize Amion for appropriate phone number to reach the unit pharmacist ([place])    09/20/2018 1:31 AM     13:30 Pm -- Zosyn to Cefepime Thank you Anette Guarneri, PharmD

## 2018-09-20 NOTE — Plan of Care (Signed)

## 2018-09-20 NOTE — Progress Notes (Signed)
VASCULAR LAB PRELIMINARY  PRELIMINARY  PRELIMINARY  PRELIMINARY  Bilateral lower extremity venous duplex completed.    Preliminary report:  See CV proc for preliminary results.   Kevontae Burgoon, RVT 09/20/2018, 3:10 PM

## 2018-09-21 LAB — SYNOVIAL CELL COUNT + DIFF, W/ CRYSTALS
Eosinophils-Synovial: 0 % (ref 0–1)
Lymphocytes-Synovial Fld: 3 % (ref 0–20)
Monocyte-Macrophage-Synovial Fluid: 0 % — ABNORMAL LOW (ref 50–90)
Neutrophil, Synovial: 97 % — ABNORMAL HIGH (ref 0–25)

## 2018-09-21 LAB — CBC WITH DIFFERENTIAL/PLATELET
Abs Immature Granulocytes: 0.03 10*3/uL (ref 0.00–0.07)
Basophils Absolute: 0 10*3/uL (ref 0.0–0.1)
Basophils Relative: 0 %
Eosinophils Absolute: 0.2 10*3/uL (ref 0.0–0.5)
Eosinophils Relative: 2 %
HCT: 37.9 % — ABNORMAL LOW (ref 39.0–52.0)
Hemoglobin: 12.4 g/dL — ABNORMAL LOW (ref 13.0–17.0)
Immature Granulocytes: 0 %
Lymphocytes Relative: 13 %
Lymphs Abs: 1.2 10*3/uL (ref 0.7–4.0)
MCH: 31.2 pg (ref 26.0–34.0)
MCHC: 32.7 g/dL (ref 30.0–36.0)
MCV: 95.5 fL (ref 80.0–100.0)
Monocytes Absolute: 1.1 10*3/uL — ABNORMAL HIGH (ref 0.1–1.0)
Monocytes Relative: 12 %
Neutro Abs: 6.9 10*3/uL (ref 1.7–7.7)
Neutrophils Relative %: 73 %
Platelets: 237 10*3/uL (ref 150–400)
RBC: 3.97 MIL/uL — ABNORMAL LOW (ref 4.22–5.81)
RDW: 14.6 % (ref 11.5–15.5)
WBC: 9.4 10*3/uL (ref 4.0–10.5)
nRBC: 0 % (ref 0.0–0.2)

## 2018-09-21 LAB — PHOSPHORUS: Phosphorus: 3 mg/dL (ref 2.5–4.6)

## 2018-09-21 LAB — COMPREHENSIVE METABOLIC PANEL
ALT: 12 U/L (ref 0–44)
AST: 11 U/L — ABNORMAL LOW (ref 15–41)
Albumin: 3 g/dL — ABNORMAL LOW (ref 3.5–5.0)
Alkaline Phosphatase: 81 U/L (ref 38–126)
Anion gap: 12 (ref 5–15)
BUN: 27 mg/dL — ABNORMAL HIGH (ref 8–23)
CO2: 24 mmol/L (ref 22–32)
Calcium: 9.7 mg/dL (ref 8.9–10.3)
Chloride: 101 mmol/L (ref 98–111)
Creatinine, Ser: 2.15 mg/dL — ABNORMAL HIGH (ref 0.61–1.24)
GFR calc Af Amer: 34 mL/min — ABNORMAL LOW (ref >60)
GFR calc non Af Amer: 30 mL/min — ABNORMAL LOW (ref >60)
Glucose, Bld: 89 mg/dL (ref 70–99)
Potassium: 3.5 mmol/L (ref 3.5–5.1)
Sodium: 137 mmol/L (ref 135–145)
Total Bilirubin: 1.2 mg/dL (ref 0.3–1.2)
Total Protein: 7.1 g/dL (ref 6.5–8.1)

## 2018-09-21 LAB — GLUCOSE, CAPILLARY
Glucose-Capillary: 95 mg/dL (ref 70–99)
Glucose-Capillary: 99 mg/dL (ref 70–99)
Glucose-Capillary: 340 mg/dL — ABNORMAL HIGH (ref 70–99)
Glucose-Capillary: 103 mg/dL — ABNORMAL HIGH (ref 70–99)

## 2018-09-21 LAB — PROTIME-INR
INR: 2.6 — ABNORMAL HIGH (ref 0.8–1.2)
Prothrombin Time: 27.4 seconds — ABNORMAL HIGH (ref 11.4–15.2)

## 2018-09-21 LAB — MAGNESIUM: Magnesium: 1.6 mg/dL — ABNORMAL LOW (ref 1.7–2.4)

## 2018-09-21 MED ORDER — WARFARIN - PHARMACIST DOSING INPATIENT
Freq: Every day | Status: DC
  Administered 2018-09-24 – 2018-09-25 (×2)

## 2018-09-21 MED ORDER — VANCOMYCIN HCL 10 G IV SOLR
1500.0000 mg | INTRAVENOUS | Status: DC
  Filled 2018-09-21: qty 1500

## 2018-09-21 MED ORDER — MAGNESIUM SULFATE 2 GM/50ML IV SOLN
2.0000 g | Freq: Once | INTRAVENOUS | Status: AC
  Administered 2018-09-21: 2 g via INTRAVENOUS
  Filled 2018-09-21: qty 50

## 2018-09-21 MED ORDER — BUPIVACAINE HCL (PF) 0.5 % IJ SOLN
10.0000 mL | Freq: Once | INTRAMUSCULAR | Status: DC
  Filled 2018-09-21: qty 10

## 2018-09-21 MED ORDER — WARFARIN SODIUM 5 MG PO TABS
5.0000 mg | ORAL_TABLET | Freq: Once | ORAL | Status: AC
  Administered 2018-09-21: 5 mg via ORAL
  Filled 2018-09-21: qty 1

## 2018-09-21 MED ORDER — PENTAFLUOROPROP-TETRAFLUOROETH EX AERO
INHALATION_SPRAY | CUTANEOUS | Status: DC | PRN
  Filled 2018-09-21: qty 116

## 2018-09-21 NOTE — Progress Notes (Signed)
Pharmacy Antibiotic Note  Marc Owen is a 73 y.o. male admitted on 09/19/2018 with R-foot infection/cellulitis.  Pharmacy has been consulted for Vancomycin and Cefepime dosing.   Noted plans for ortho to aspirate the joint today for evaluation. Will adjust the Vancomycin dose slightly.   Plan: - Adjust Vancomycin to 1500 mg IV every 48 hours (est AUC 471, SCr 2.15, Vd 0.5) - Continue Cefepime 2g IV every 12 hours - Will continue to follow renal function, culture results, LOT, and antibiotic de-escalation plans   Height: 5\' 7"  (170.2 cm) Weight: 218 lb 14.7 oz (99.3 kg) IBW/kg (Calculated) : 66.1  Temp (24hrs), Avg:98.3 F (36.8 C), Min:98.2 F (36.8 C), Max:98.3 F (36.8 C)  Recent Labs  Lab 09/20/18 0024 09/21/18 0619  WBC 10.6* 9.4  CREATININE 2.20* 2.15*    Estimated Creatinine Clearance: 34.9 mL/min (A) (by C-G formula based on SCr of 2.15 mg/dL (H)).    Not on File  Zosyn 8/2 >> 8/2 Vanc 8/2 >> Cefepime 8/2 >>  8/1 COVID >> neg  Thank you for allowing pharmacy to be a part of this patient's care.  Alycia Rossetti, PharmD, BCPS Clinical Pharmacist Clinical phone for 09/21/2018: [phone] 09/21/2018 11:13 AM   **Pharmacist phone directory can now be found on [email or web address] (PW [email or web address]).  Listed under [place].

## 2018-09-21 NOTE — Plan of Care (Signed)
  Problem: Education: Goal: Knowledge of General Education information will improve Description: Including pain rating scale, medication(s)/side effects and non-pharmacologic comfort measures Outcome: Progressing   Problem: Health Behavior/Discharge Planning: Goal: Ability to manage health-related needs will improve Outcome: Progressing   Problem: Activity: Goal: Risk for activity intolerance will decrease Outcome: Progressing   

## 2018-09-21 NOTE — Progress Notes (Signed)
ANTICOAGULATION CONSULT NOTE - Initial Consult  Pharmacy Consult for Warfarin Indication: atrial fibrillation  Not on File  Patient Measurements: Height: 5\' 7"  (170.2 cm) Weight: 218 lb 14.7 oz (99.3 kg) IBW/kg (Calculated) : 66.1  Vital Signs: Temp: 98.2 F (36.8 C) (08/03 0856) Temp Source: Oral (08/03 0856) BP: 116/86 (08/03 0856) Pulse Rate: 83 (08/03 0856)  Labs: Recent Labs    09/20/18 0024 09/21/18 0619  HGB 12.7* 12.4*  HCT 39.0 37.9*  PLT 244 237  LABPROT  --  27.4*  INR  --  2.6*  CREATININE 2.20* 2.15*    Estimated Creatinine Clearance: 34.9 mL/min (A) (by C-G formula based on SCr of 2.15 mg/dL (H)).   Medical History: History reviewed. No pertinent past medical history.  Assessment: 19 YOM who presented on 8/1 with R-foot swelling/pain. The patient was on warfarin PTA for hx Afib. PTA dose was 2.5 mg daily EXCEPT for 5 mg on Mon/Fri. Warfarin held initially due to need for interventions, now okayed to resume s/p aspiration by ortho today. Pharmacy consulted to dose.   INR today is therapeutic despite a missed dose on 8/2. The patient is s/p aspiration by ortho with no organisms seen on gram stain. Ortho has okayed restart of warfarin.   Goal of Therapy:  INR 2-3 Monitor platelets by anticoagulation protocol: Yes   Plan:  - Warfarin 5 mg x 1 dose at 1800 today - Daily PT/INR, CBC q72h - Will continue to monitor for any signs/symptoms of bleeding and will follow up with PT/INR in the a.m.   Thank you for allowing pharmacy to be a part of this patient's care.  Alycia Rossetti, PharmD, BCPS Clinical Pharmacist Clinical phone for 09/21/2018: [phone] 09/21/2018 11:20 AM   **Pharmacist phone directory can now be found on [email or web address] ([email or web address]).  Listed under [place].

## 2018-09-21 NOTE — Plan of Care (Signed)
  Problem: Activity: Goal: Risk for activity intolerance will decrease Outcome: Progressing   

## 2018-09-21 NOTE — Progress Notes (Signed)
Patient ID: Marc Owen, male   DOB: 01/28/1946, 73 y.o.   MRN: [id number]   LOS: 2 days   Subjective: R leg is hurting quite a bit after vascular US but ankle seems better.   Objective: Vital signs in last 24 hours: Temp:  [98.2 F (36.8 C)-98.3 F (36.8 C)] 98.2 F (36.8 C) (08/03 0856) Pulse Rate:  [83-94] 83 (08/03 0856) Resp:  [18-24] 18 (08/03 0856) BP: (116-154)/(84-98) 116/86 (08/03 0856) SpO2:  [94 %-100 %] 98 % (08/03 0856) Last BM Date: 09/19/18   Laboratory  CBC Recent Labs    09/20/18 0024 09/21/18 0619  WBC 10.6* 9.4  HGB 12.7* 12.4*  HCT 39.0 37.9*  PLT 244 237   BMET Recent Labs    09/20/18 0024 09/21/18 0619  NA 139 137  K 3.5 3.5  CL 105 101  CO2 28 24  GLUCOSE 134* 89  BUN 25* 27*  CREATININE 2.20* 2.15*  CALCIUM 9.5 9.7     Physical Exam General appearance: alert and no distress  RLE: Minimal pain with AROM/PROM ankle. No erythema. + effusion. 2+ DP.   Assessment/Plan: Right ankle pain -- Septic joint highly unlikely but have sent aspirate for GS, C&S, and cell count. Dr. [name] to f/u.    Lisette Abu, PA-C Orthopedic Surgery [phone] 09/21/2018

## 2018-09-21 NOTE — Progress Notes (Signed)
PROGRESS NOTE    Marc Owen  ZOX:[id number] DOB: March 12, 1945 DOA: 09/19/2018 PCP: Patient, No Pcp Per   Brief Narrative:  HPI per Dr. [name] on 09/19/2018 Marc Owen is a 73 y.o. male with medical history significant of type 2 diabetes, COPD, atrial fibrillation, coronary artery disease, morbid obesity who presented to the ER in [place] with complaint of right ankle pain swelling and tenderness.  He was suspected to have cellulitis and possibly septic arthritis of the right ankle joint.  He was hemodynamically is stable except for significant tenderness swelling of lab changes showing white count of 11.1 elevated ESR as well as x-ray showing nose evidence of osteomyelitis.  Due to lack of orthopedic surgeon in the hospital patient was transferred here so orthopedics can evaluate patient.  Dr. [name] from [place] has accepted to see the patient.  Patient denied any fever or chills at the moment.  He has been unable to put weight on his feet.  He has been taking his medications for blood sugar as well as hypertension.  He has been accepted and will be admitted for medical service..  ED Course: In the outside ED temperature was 99 blood pressure 140/90 pulse 94 respirate 20 oxygen sat 94% room air.  His white count is 11.1 ESR 58 creatinine 2.12, right ankle films showed erosions but no osteomyelitis.  **Interim history Orthopedic surgery evaluated and do not feel that the patient's foot is infected given no erythema but they do feel that something underlying may be going on so they recommend an MRI of the foot to look for insufficiency fracture versus other causes for erosions and cystic appearing changes of the bone.  At this point orthopedics do not feel the need to aspirate the ankle there currently await MRI results and update the plan when MRI is completed.  Right now they recommending nonweightbearing on the right leg extremity.    MRI done shows a large ankle joint  effusion mild degenerative changes and no tibiotalar erosive destructive joint changes or marrow edema to suggest septic arthritis or inflammatory arthropathy there is also diffuse and fairly marked subcutaneous soft tissue swelling and edema and fluid of unclear etiology which could reflect a cellulitis which make the ankle joint effusion somewhat more worrisome there is no discrete drainable soft tissue abscess noted.  There is no stress fractures or AVN.  Because MRI was unrevealing we have ordered a lower extremity duplex to rule out DVT and this was negative.  Orthopedic surgery evaluated again today and recommended a joint aspiration and they aspirated the right ankle with aspirate sent for Gram stain and culture as well as cell count.  4 mL of bloody clear fluid was obtained and patient tolerated the procedure well.  Appreciate further [name] evaluation recommendations.  Assessment & Plan:   Principal Problem:   Cellulitis of right ankle Active Problems:   DM (diabetes mellitus) (HCC)   Benign essential HTN   CAD (coronary artery disease)   COPD (chronic obstructive pulmonary disease) (HCC)   CKD (chronic kidney disease), stage III (HCC)   Chronic a-fib   Pain and swelling of ankle, right   Pain and swelling of lower extremity, right  Right foot pain with concern for underlying infection with concern for cellulitis -Suspected septic arthritis initially but foot is not warm or erythematous so orthopedics feels like this is less likely infection.  -Orthopedics Dr. [name] consulted for further evaluation recommendation.   -He was initially initiated on Vancomycin  and Zosyn; see changes below -Continue with pain management.   -Continue to elevate the foot and orthopedics recommending nonweightbearing.   -Further imaging as recommended by orthopedics and they obtain an MRI of the ankle and foot -MRI of the ankle and foot showed "Large ankle joint effusion and mild degenerative changes. No  tibiotalar erosions or destructive bony changes or marrow edema to suggest septic arthritis or inflammatory arthropathy. Cystic lesion in the base of the third metatarsal has nonaggressive MR and plain film features is likely a benign bone cyst or intraosseous ganglion. No stress fractures or AVN.  Diffuse and fairly marked subcutaneous soft tissue swelling/edema/fluid of uncertain etiology. It could reflect cellulitis which would make the ankle joint effusions somewhat more worrisome. No discrete drainable soft tissue abscess. Mild diffuse myositis." -Continued and had changed IV Zosyn to IV cefepime given concern for nephrotoxicity with vancomycin; no source of infection was found so we will stop the vancomycin as well as cefepime currently and Monitor off of Abx -WBC on admission was 10.6 and repeat was 9.4; patient has been afebrile for last 24 hours -Further care per orthopedic surgery and they initially recommending not aspirating the joint currently until MRI is resulted; since MRIs been done they are going to aspirate the joint of the right ankle and this has been sent off for culture Gram stain as well as cell count -Continued with IV fluid gentle hydration at 50 mL's per hour;  -Continue with further supportive care with pain control with 2 mg of IV morphine every 3 hours PRN for severe pain as well as antiemetics with Zofran 4 mg p.o./IV every 6 as needed nausea -He had some leg swelling so we obtained venous duplex to rule out DVT component of the swelling and this was negative  Atrial Fibrillation -Patient is on warfarin.   -INR was 3.1 prior to arrival and repeat this morning was 2.6 -We will consult pharmacy for Coumadin management and adjustment and will resume after his joint aspiration; patient did receive a dose of warfarin 5 mg tonight and continue to monitor with daily PT/INR -Since patient had joint aspiration will resume warfarin -Resumed home medications including diltiazem 120  mg p.o. daily along with metoprolol tartrate 100 g p.o. twice daily  Diabetes Mellitus Type II -Patient is hemoglobin A1c was 6.8 on admission -CBGs have been ranging from 72-105 -Currently holding his glipizide 2 mg p.o. daily -Resumed home gabapentin 300 mg p.o. twice daily for diabetic neuropathy -Continue with Resistant sliding scale insulin before meals and at bedtime -Continue monitor and trend CBGs and adjust insulin as necessary  Hypertension -Continue blood pressure control when pharmacy verifies his medications -Blood pressure this morning was 116/86 -Resumed home amlodipine 5 mg p.o. daily along with diltiazem 120 mg p.o. daily and metoprolol 100 mg p.o. twice daily  Coronary Artery Disease -Patient appears to be at baseline and not complaining of any chest pain. -Continue monitoring and resume home medications once verified by pharmacy -We resumed his aspirin 81 mg p.o. daily, metoprolol 100 mg p.o. twice daily, and pravastatin 40 g p.o. nightly  AKI on Chronic kidney disease stage III, improving -BUN/creatinine was 25/2.20 on admission and repeat this morning was 27/2.15 -Continued gentle IV fluid hydration with normal saline rate of 50 mL's per hour now stopped -Restarted home Lasix at 40 mg p.o. daily -Avoid nephrotoxic medications, contrast dyes, as well as hypertension -Continue monitor and trend renal function -continue with Cinacalcet at 30 mg p.o. twice daily  with meals -Repeat CMP in a.m.  COPD -Currently not in Exacerbation -Continue with home regimen when Medications are verified by Pharmacy -Does not appear to be on any inhalers currently  Normocytic Anemia -Patient's hemoglobin/hematocrit on admission was 12.7/31.0 -Check anemia panel in the a.m. -Continue to monitor for signs and symptoms of bleeding; currently no overt bleeding noted -Repeat CBC in the a.m.  Obesity -Estimated body mass index is 34.29 kg/m as calculated from the following:    Height as of this encounter: _0  (1.702 m).   Weight as of this encounter: 99.3 kg. -Weight Loss and Dietary Counseling given   Hypothyroidism -Check TSH -Resumed levothyroxine 175 mcg p.o. daily  GERD -Continue with pantoprazole 40 mg p.o. daily  Depression and Anxiety -Continue venlafaxine XR 150 g p.o. daily with breakfast  Gout -Continue allopurinol 200 mg p.o. twice daily  Normocytic Anemia/Anemia likely of Chronic Kidney Disease -Patient's hemoglobin/hematocrit went from 12.7/39.9 is now 12.4/37.9 -Check anemia panel in AM -Continue to monitor for signs and symptoms of bleeding; currently no overt bleeding noted -Repeat CBC in the a.m.  DVT prophylaxis: Heparin 5000 units subcu every 8h Code Status: FULL CODE Family Communication: No Family present at bedside  Disposition Plan:   Consultants:   Orthopedic Surgery Dr. [name]    Procedures: MRI of the Ankle and Foot  Right ankle aspiration and injection done by Hilbert Odor of orthopedics with 5 mL's of 0.5% lidocaine instilled  Antimicrobials:  Anti-infectives (From admission, onward)   Start     Dose/Rate Route Frequency Ordered Stop   09/22/18 0230  vancomycin (VANCOCIN) 1,500 mg in sodium chloride 0.9 % 500 mL IVPB     1,500 mg 250 mL/hr over 120 Minutes Intravenous Every 48 hours 09/21/18 1111     09/20/18 1800  ceFEPIme (MAXIPIME) 2 g in sodium chloride 0.9 % 100 mL IVPB     2 g 200 mL/hr over 30 Minutes Intravenous Every 12 hours 09/20/18 1322     09/20/18 0800  piperacillin-tazobactam (ZOSYN) IVPB 3.375 g  Status:  Discontinued     3.375 g 12.5 mL/hr over 240 Minutes Intravenous Every 8 hours 09/20/18 0131 09/20/18 1317   09/20/18 0145  vancomycin (VANCOCIN) 1,750 mg in sodium chloride 0.9 % 500 mL IVPB  Status:  Discontinued     1,750 mg 250 mL/hr over 120 Minutes Intravenous Every 48 hours 09/20/18 0131 09/21/18 1111   09/19/18 2300  piperacillin-tazobactam (ZOSYN) IVPB 3.375 g     3.375 g 100  mL/hr over 30 Minutes Intravenous  Once 09/19/18 2226 09/20/18 0046     Subjective: Seen and examined at bedside and states that his ankle is doing better and not as painful but remained swollen.  Main complaint was leg pain specifically after his venous duplex.  States that the back of his knee hurts tremendously.  No nausea or vomiting.  Denies any lightheadedness or dizziness.  No other concerns complaints at this time.  Objective: Vitals:   09/20/18 1737 09/20/18 2113 09/21/18 0354 09/21/18 0856  BP: (!) 154/98 124/84 (!) 135/91 116/86  Pulse: 94 89 85 83  Resp: 20 (!) 22 (!) 24 18  Temp: 98.3 F (36.8 C) 98.3 F (36.8 C) 98.2 F (36.8 C) 98.2 F (36.8 C)  TempSrc: Oral Oral Oral Oral  SpO2: 94% 96% 100% 98%  Weight:      Height:        Intake/Output Summary (Last 24 hours) at 09/21/2018 1614 Last data filed at 09/21/2018  1400 Gross per 24 hour  Intake 1807.84 ml  Output 1350 ml  Net 457.84 ml   Filed Weights   09/20/18 0041  Weight: 99.3 kg   Examination: Physical Exam:  Constitutional: Well-nourished, well-developed obese Caucasian male currently no acute distress but still complaining of some pain in her right leg but now more so on the back of the leg with his right foot still swollen Eyes: Lids and conjunctive are normal.  Sclera anicteric ENMT: External ears nose appear normal.  Grossly normal hearing Neck: Appears supple no JVD Respiratory: Slight diminished auscultation bilaterally no appreciable wheezing, rales, rhonchi.  Patient not tachypneic using accessory muscle breathe Cardiovascular: Irregularly irregular.  No appreciable murmurs, rubs, gallops.  Has 1+ lower extremity edema on the right compared to left Abdomen: Soft, nontender, distended secondary to body habitus.  Bowel sounds are present GU: Deferred Musculoskeletal: Right foot is swollen and painful to palpation with some warmth.  Leg also some swelling and right knee is slightly tender to  palpate Skin: Skin is warm and dry no appreciable rashes or lesions on skin evaluation Neurologic: Cranial nerves II through XII gross intact no appreciable focal deficit Psychiatric: Is a normal judgment and insight.  Patient is awake, alert, oriented.  Slightly anxious appearing  Data Reviewed: I have personally reviewed following labs and imaging studies  CBC: Recent Labs  Lab 09/20/18 0024 09/21/18 0619  WBC 10.6* 9.4  NEUTROABS  --  6.9  HGB 12.7* 12.4*  HCT 39.0 37.9*  MCV 96.3 95.5  PLT 244 027   Basic Metabolic Panel: Recent Labs  Lab 09/20/18 0024 09/21/18 0619  NA 139 137  K 3.5 3.5  CL 105 101  CO2 28 24  GLUCOSE 134* 89  BUN 25* 27*  CREATININE 2.20* 2.15*  CALCIUM 9.5 9.7  MG  --  1.6*  PHOS  --  3.0   GFR: Estimated Creatinine Clearance: 34.9 mL/min (A) (by C-G formula based on SCr of 2.15 mg/dL (H)). Liver Function Tests: Recent Labs  Lab 09/20/18 0024 09/21/18 0619  AST 13* 11*  ALT 14 12  ALKPHOS 97 81  BILITOT 0.8 1.2  PROT 7.4 7.1  ALBUMIN 3.6 3.0*   No results for input(s): LIPASE, AMYLASE in the last 168 hours. No results for input(s): AMMONIA in the last 168 hours. Coagulation Profile: Recent Labs  Lab 09/21/18 0619  INR 2.6*   Cardiac Enzymes: No results for input(s): CKTOTAL, CKMB, CKMBINDEX, TROPONINI in the last 168 hours. BNP (last 3 results) No results for input(s): PROBNP in the last 8760 hours. HbA1C: Recent Labs    09/20/18 0024  HGBA1C 6.8*   CBG: Recent Labs  Lab 09/20/18 1127 09/20/18 1638 09/20/18 2113 09/21/18 0655 09/21/18 1127  GLUCAP 72 105* 110* 95 99   Lipid Profile: No results for input(s): CHOL, HDL, LDLCALC, TRIG, CHOLHDL, LDLDIRECT in the last 72 hours. Thyroid Function Tests: No results for input(s): TSH, T4TOTAL, FREET4, T3FREE, THYROIDAB in the last 72 hours. Anemia Panel: No results for input(s): VITAMINB12, FOLATE, FERRITIN, TIBC, IRON, RETICCTPCT in the last 72 hours. Sepsis Labs: No  results for input(s): PROCALCITON, LATICACIDVEN in the last 168 hours.  Recent Results (from the past 240 hour(s))  SARS CORONAVIRUS 2 Nasal Swab Aptima Multi Swab     Status: None   Collection Time: 09/19/18 11:28 PM   Specimen: Aptima Multi Swab; Nasal Swab  Result Value Ref Range Status   SARS Coronavirus 2 NEGATIVE NEGATIVE Final    Comment: (  NOTE) SARS-CoV-2 target nucleic acids are NOT DETECTED. The SARS-CoV-2 RNA is generally detectable in upper and lower respiratory specimens during the acute phase of infection. Negative results do not preclude SARS-CoV-2 infection, do not rule out co-infections with other pathogens, and should not be used as the sole basis for treatment or other patient management decisions. Negative results must be combined with clinical observations, patient history, and epidemiological information. The expected result is Negative. Fact Sheet for Patients: SugarRoll.be Fact Sheet for Healthcare Providers: https://www.woods-mathews.com/ This test is not yet approved or cleared by the [place] FDA and  has been authorized for detection and/or diagnosis of SARS-CoV-2 by FDA under an Emergency Use Authorization (EUA). This EUA will remain  in effect (meaning this test can be used) for the duration of the COVID-19 declaration under Section 56 4(b)(1) of the Act, 21 U.S.C. section 360bbb-3(b)(1), unless the authorization is terminated or revoked sooner. Performed at [facility], [place] [address], [place], [place] [address]   Body fluid culture     Status: None (Preliminary result)   Collection Time: 09/21/18 11:26 AM   Specimen: Ankle; Body Fluid  Result Value Ref Range Status   Specimen Description ANKLE  Final   Special Requests NONE  Final   Gram Stain   Final    FEW WBC PRESENT, PREDOMINANTLY PMN NO ORGANISMS SEEN Performed at [facility], [address], [place], [place] [address]     Culture PENDING  Incomplete   Report Status PENDING  Incomplete    Radiology Studies: Dg Ankle Complete Right  Result Date: 09/20/2018 CLINICAL DATA:  Cellulitis, pain, swelling EXAM: RIGHT ANKLE - COMPLETE 3+ VIEW COMPARISON:  None. FINDINGS: No acute bony abnormality. Specifically, no fracture, subluxation, or dislocation. Mild diffuse soft tissue swelling. No bone destruction to suggest osteomyelitis. IMPRESSION: No acute bony abnormality. Electronically Signed   By: Rolm Baptise M.D.   On: 09/20/2018 01:18   Mr Foot Right Wo Contrast  Result Date: 09/20/2018 CLINICAL DATA:  Diffuse foot and ankle pain.  Abnormal foot films. EXAM: MRI OF THE RIGHT ANKLE WITHOUT CONTRAST; MRI OF THE RIGHT FOREFOOT WITHOUT CONTRAST TECHNIQUE: Multiplanar, multisequence MR imaging of the ankle was performed. No intravenous contrast was administered. COMPARISON:  Radiographs 09/19/2018 and 09/20/2018. FINDINGS: Examination is quite limited due to patient motion. TENDONS Peroneal: Intact Posteromedial: Intact Anterior: Intact Achilles: Intact Plantar Fascia: Intact LIGAMENTS Lateral: Intact Medial: Intact CARTILAGE Ankle Joint: There is a large ankle joint effusion. Mild to moderate degenerative chondrosis with joint space narrowing but no full-thickness cartilage defect or osteochondral lesion. Cystic lucency noted in the distal fibula appears fairly well corticated and there is no surrounding edema like inflammatory changes. This could be a large subchondral cyst or intraosseous ganglion. Do not see any definite erosions involving the tibia or talus. Subtalar Joints/Sinus Tarsi: Mild subtalar joint degenerative changes and small joint effusions. Mild inflammation/edema in the sinus tarsi but the cervical and interosseous ligaments appear intact. Bones: As demonstrated on the radiographs there is cystic change in the third metatarsal base. This appears to be a multi septated cystic lesion, possibly a benign bone cyst or  intraosseous ganglion. I do not see any surrounding edema or inflammation to suggest a pathologic fracture or aggressive lesion. No metatarsal stress fractures. Other: Diffuse and fairly significant subcutaneous soft tissue swelling/edema/fluid which is nonspecific but certainly could reflect cellulitis. There is also nonspecific edema in the foot musculature suggesting myositis. No discrete drainable soft tissue abscess is  identified. IMPRESSION: 1. Large ankle joint effusion and mild degenerative changes. No tibiotalar erosions or destructive bony changes or marrow edema to suggest septic arthritis or inflammatory arthropathy. 2. Cystic lesion in the base of the third metatarsal has nonaggressive MR and plain film features is likely a benign bone cyst or intraosseous ganglion. 3. No stress fractures or AVN. 4. Diffuse and fairly marked subcutaneous soft tissue swelling/edema/fluid of uncertain etiology. It could reflect cellulitis which would make the ankle joint effusions somewhat more worrisome. No discrete drainable soft tissue abscess. Mild diffuse myositis. Electronically Signed   By: Marijo Sanes M.D.   On: 09/20/2018 11:16   Mr Ankle Right Wo Contrast  Result Date: 09/20/2018 CLINICAL DATA:  Diffuse foot and ankle pain.  Abnormal foot films. EXAM: MRI OF THE RIGHT ANKLE WITHOUT CONTRAST; MRI OF THE RIGHT FOREFOOT WITHOUT CONTRAST TECHNIQUE: Multiplanar, multisequence MR imaging of the ankle was performed. No intravenous contrast was administered. COMPARISON:  Radiographs 09/19/2018 and 09/20/2018. FINDINGS: Examination is quite limited due to patient motion. TENDONS Peroneal: Intact Posteromedial: Intact Anterior: Intact Achilles: Intact Plantar Fascia: Intact LIGAMENTS Lateral: Intact Medial: Intact CARTILAGE Ankle Joint: There is a large ankle joint effusion. Mild to moderate degenerative chondrosis with joint space narrowing but no full-thickness cartilage defect or osteochondral lesion. Cystic  lucency noted in the distal fibula appears fairly well corticated and there is no surrounding edema like inflammatory changes. This could be a large subchondral cyst or intraosseous ganglion. Do not see any definite erosions involving the tibia or talus. Subtalar Joints/Sinus Tarsi: Mild subtalar joint degenerative changes and small joint effusions. Mild inflammation/edema in the sinus tarsi but the cervical and interosseous ligaments appear intact. Bones: As demonstrated on the radiographs there is cystic change in the third metatarsal base. This appears to be a multi septated cystic lesion, possibly a benign bone cyst or intraosseous ganglion. I do not see any surrounding edema or inflammation to suggest a pathologic fracture or aggressive lesion. No metatarsal stress fractures. Other: Diffuse and fairly significant subcutaneous soft tissue swelling/edema/fluid which is nonspecific but certainly could reflect cellulitis. There is also nonspecific edema in the foot musculature suggesting myositis. No discrete drainable soft tissue abscess is identified. IMPRESSION: 1. Large ankle joint effusion and mild degenerative changes. No tibiotalar erosions or destructive bony changes or marrow edema to suggest septic arthritis or inflammatory arthropathy. 2. Cystic lesion in the base of the third metatarsal has nonaggressive MR and plain film features is likely a benign bone cyst or intraosseous ganglion. 3. No stress fractures or AVN. 4. Diffuse and fairly marked subcutaneous soft tissue swelling/edema/fluid of uncertain etiology. It could reflect cellulitis which would make the ankle joint effusions somewhat more worrisome. No discrete drainable soft tissue abscess. Mild diffuse myositis. Electronically Signed   By: Marijo Sanes M.D.   On: 09/20/2018 11:16   Dg Foot Complete Right  Result Date: 09/20/2018 CLINICAL DATA:  Cellulitis, pain, swelling EXAM: RIGHT FOOT COMPLETE - 3+ VIEW COMPARISON:  None. FINDINGS: No  acute bony abnormality. Specifically, no fracture, subluxation, or dislocation. Soft tissue swelling along the dorsum of the foot. No bone destruction to suggest osteomyelitis IMPRESSION: No acute bony abnormality. Electronically Signed   By: Rolm Baptise M.D.   On: 09/20/2018 01:17   Vas [place] Lower Extremity Venous (dvt)  Result Date: 09/21/2018  Lower Venous Study Indications: Right Foot Pain, and Swelling. Other Indications: Patient is on Warfarin with an INR of 3.1. Comparison Study: No prior study on file for comparison.  Performing Technologist: Sharion Dove RVS  Examination Guidelines: A complete evaluation includes B-mode imaging, spectral Doppler, color Doppler, and power Doppler as needed of all accessible portions of each vessel. Bilateral testing is considered an integral part of a complete examination. Limited examinations for reoccurring indications may be performed as noted.  +---------+---------------+---------+-----------+----------+-------+  RIGHT     Compressibility Phasicity Spontaneity Properties Summary  +---------+---------------+---------+-----------+----------+-------+  CFV       Full            Yes       Yes                             +---------+---------------+---------+-----------+----------+-------+  SFJ       Full                                                      +---------+---------------+---------+-----------+----------+-------+  FV Prox   Full                                                      +---------+---------------+---------+-----------+----------+-------+  FV Mid    Full                                                      +---------+---------------+---------+-----------+----------+-------+  FV Distal Full                                                      +---------+---------------+---------+-----------+----------+-------+  PFV       Full                                                      +---------+---------------+---------+-----------+----------+-------+  POP        Full            Yes       Yes                             +---------+---------------+---------+-----------+----------+-------+  PTV       Full                                                      +---------+---------------+---------+-----------+----------+-------+  PERO      Full                                                      +---------+---------------+---------+-----------+----------+-------+  GSV       Full                                                      +---------+---------------+---------+-----------+----------+-------+  SSV       Full                                                      +---------+---------------+---------+-----------+----------+-------+   +---------+---------------+---------+-----------+----------+-------+  LEFT      Compressibility Phasicity Spontaneity Properties Summary  +---------+---------------+---------+-----------+----------+-------+  CFV       Full            Yes       Yes                             +---------+---------------+---------+-----------+----------+-------+  SFJ       Full                                                      +---------+---------------+---------+-----------+----------+-------+  FV Prox   Full                                                      +---------+---------------+---------+-----------+----------+-------+  FV Mid    Full                                                      +---------+---------------+---------+-----------+----------+-------+  FV Distal Full                                                      +---------+---------------+---------+-----------+----------+-------+  PFV       Full                                                      +---------+---------------+---------+-----------+----------+-------+  POP       Full            Yes       Yes                             +---------+---------------+---------+-----------+----------+-------+  PTV       Full                                                       +---------+---------------+---------+-----------+----------+-------+  PERO      Full                                                      +---------+---------------+---------+-----------+----------+-------+  GSV       Full                                                      +---------+---------------+---------+-----------+----------+-------+     Summary: Right: There is no evidence of deep vein thrombosis in the lower extremity. There is no evidence of superficial venous thrombosis. Left: No evidence of deep vein thrombosis in the lower extremity. There is no evidence of superficial venous thrombosis.  *See table(s) above for measurements and observations. Electronically signed by Ruta Hinds MD on 09/21/2018 at 4:04:03 PM.    Final    Scheduled Meds:  allopurinol  200 mg Oral BID   amLODipine  5 mg Oral Daily   aspirin EC  81 mg Oral Daily   bupivacaine  10 mL Infiltration Once   cinacalcet  30 mg Oral BID WC   diltiazem  120 mg Oral Daily   furosemide  40 mg Oral Daily   gabapentin  300 mg Oral BID   insulin aspart  0-20 Units Subcutaneous TID WC   insulin aspart  0-5 Units Subcutaneous QHS   levothyroxine  175 mcg Oral Q0600   metoprolol tartrate  100 mg Oral BID   pantoprazole  40 mg Oral Daily   pravastatin  40 mg Oral QHS   venlafaxine XR  150 mg Oral Q breakfast   warfarin  5 mg Oral ONCE-1800   Warfarin - Pharmacist Dosing Inpatient   Does not apply q1800   Continuous Infusions:  sodium chloride 50 mL/hr at 09/21/18 0946   ceFEPime (MAXIPIME) IV 2 g (09/21/18 0604)   [START ON 09/22/2018] vancomycin      LOS: 2 days   Kerney Elbe, DO [facility] PAGER is on [place]  If 7PM-7AM, please contact night-coverage [email or web address] Password [email or web address] 09/21/2018, 4:14 PM

## 2018-09-21 NOTE — Procedures (Signed)
Procedure: Right ankle aspiration and injection  Indication: Right ankle effusion(s)  Surgeon: Silvestre Gunner, PA-C  Assist: None  Anesthesia: Topical refrigerant spray  EBL: None  Complications: None  Findings: After risks/benefits explained patient desires to undergo procedure. Consent obtained and time out performed. The right ankle was sterilely prepped and aspirated. 5ml bloody clear fluid obtained. 21ml 0.5% lidocaine instilled. Pt tolerated the procedure well.    Lisette Abu, PA-C Orthopedic Surgery [phone]

## 2018-09-22 LAB — COMPREHENSIVE METABOLIC PANEL
ALT: 14 U/L (ref 0–44)
AST: 13 U/L — ABNORMAL LOW (ref 15–41)
Albumin: 2.8 g/dL — ABNORMAL LOW (ref 3.5–5.0)
Alkaline Phosphatase: 91 U/L (ref 38–126)
Anion gap: 13 (ref 5–15)
BUN: 34 mg/dL — ABNORMAL HIGH (ref 8–23)
CO2: 22 mmol/L (ref 22–32)
Calcium: 9.7 mg/dL (ref 8.9–10.3)
Chloride: 101 mmol/L (ref 98–111)
Creatinine, Ser: 2.24 mg/dL — ABNORMAL HIGH (ref 0.61–1.24)
GFR calc Af Amer: 33 mL/min — ABNORMAL LOW (ref >60)
GFR calc non Af Amer: 28 mL/min — ABNORMAL LOW (ref >60)
Glucose, Bld: 90 mg/dL (ref 70–99)
Potassium: 3.4 mmol/L — ABNORMAL LOW (ref 3.5–5.1)
Sodium: 136 mmol/L (ref 135–145)
Total Bilirubin: 1.2 mg/dL (ref 0.3–1.2)
Total Protein: 7 g/dL (ref 6.5–8.1)

## 2018-09-22 LAB — FERRITIN: Ferritin: 242 ng/mL (ref 24–336)

## 2018-09-22 LAB — PROTIME-INR
INR: 2.1 — ABNORMAL HIGH (ref 0.8–1.2)
Prothrombin Time: 23.5 seconds — ABNORMAL HIGH (ref 11.4–15.2)

## 2018-09-22 LAB — CBC WITH DIFFERENTIAL/PLATELET
Abs Immature Granulocytes: 0.06 10*3/uL (ref 0.00–0.07)
Basophils Absolute: 0 10*3/uL (ref 0.0–0.1)
Basophils Relative: 0 %
Eosinophils Absolute: 0.2 10*3/uL (ref 0.0–0.5)
Eosinophils Relative: 2 %
HCT: 35.7 % — ABNORMAL LOW (ref 39.0–52.0)
Hemoglobin: 11.8 g/dL — ABNORMAL LOW (ref 13.0–17.0)
Immature Granulocytes: 1 %
Lymphocytes Relative: 13 %
Lymphs Abs: 1.2 10*3/uL (ref 0.7–4.0)
MCH: 31.6 pg (ref 26.0–34.0)
MCHC: 33.1 g/dL (ref 30.0–36.0)
MCV: 95.5 fL (ref 80.0–100.0)
Monocytes Absolute: 1 10*3/uL (ref 0.1–1.0)
Monocytes Relative: 11 %
Neutro Abs: 7 10*3/uL (ref 1.7–7.7)
Neutrophils Relative %: 73 %
Platelets: 218 10*3/uL (ref 150–400)
RBC: 3.74 MIL/uL — ABNORMAL LOW (ref 4.22–5.81)
RDW: 14.6 % (ref 11.5–15.5)
WBC: 9.5 10*3/uL (ref 4.0–10.5)
nRBC: 0 % (ref 0.0–0.2)

## 2018-09-22 LAB — TSH: TSH: 1.363 u[IU]/mL (ref 0.350–4.500)

## 2018-09-22 LAB — VITAMIN B12: Vitamin B-12: 184 pg/mL (ref 180–914)

## 2018-09-22 LAB — RETICULOCYTES
Immature Retic Fract: 16.6 % — ABNORMAL HIGH (ref 2.3–15.9)
RBC.: 3.74 MIL/uL — ABNORMAL LOW (ref 4.22–5.81)
Retic Count, Absolute: 64.7 10*3/uL (ref 19.0–186.0)
Retic Ct Pct: 1.7 % (ref 0.4–3.1)

## 2018-09-22 LAB — IRON AND TIBC
Iron: 28 ug/dL — ABNORMAL LOW (ref 45–182)
Saturation Ratios: 9 % — ABNORMAL LOW (ref 17.9–39.5)
TIBC: 328 ug/dL (ref 250–450)
UIBC: 300 ug/dL

## 2018-09-22 LAB — FOLATE: Folate: 8.1 ng/mL (ref >5.9)

## 2018-09-22 LAB — GLUCOSE, CAPILLARY
Glucose-Capillary: 84 mg/dL (ref 70–99)
Glucose-Capillary: 109 mg/dL — ABNORMAL HIGH (ref 70–99)
Glucose-Capillary: 122 mg/dL — ABNORMAL HIGH (ref 70–99)
Glucose-Capillary: 123 mg/dL — ABNORMAL HIGH (ref 70–99)

## 2018-09-22 LAB — MAGNESIUM: Magnesium: 2 mg/dL (ref 1.7–2.4)

## 2018-09-22 LAB — PHOSPHORUS: Phosphorus: 2.7 mg/dL (ref 2.5–4.6)

## 2018-09-22 MED ORDER — GUAIFENESIN ER 600 MG PO TB12
1200.0000 mg | ORAL_TABLET | Freq: Two times a day (BID) | ORAL | Status: DC
  Administered 2018-09-22 – 2018-09-25 (×7): 1200 mg via ORAL
  Filled 2018-09-22 (×7): qty 2

## 2018-09-22 MED ORDER — IPRATROPIUM BROMIDE 0.02 % IN SOLN
0.5000 mg | Freq: Three times a day (TID) | RESPIRATORY_TRACT | Status: DC
  Administered 2018-09-23 – 2018-09-24 (×6): 0.5 mg via RESPIRATORY_TRACT
  Filled 2018-09-22 (×6): qty 2.5

## 2018-09-22 MED ORDER — LEVALBUTEROL HCL 0.63 MG/3ML IN NEBU
0.6300 mg | INHALATION_SOLUTION | Freq: Four times a day (QID) | RESPIRATORY_TRACT | Status: DC
  Administered 2018-09-22 (×2): 0.63 mg via RESPIRATORY_TRACT
  Filled 2018-09-22 (×2): qty 3

## 2018-09-22 MED ORDER — FUROSEMIDE 40 MG PO TABS
40.0000 mg | ORAL_TABLET | Freq: Two times a day (BID) | ORAL | Status: DC
  Administered 2018-09-22 – 2018-09-25 (×7): 40 mg via ORAL
  Filled 2018-09-22 (×7): qty 1

## 2018-09-22 MED ORDER — INSULIN ASPART 100 UNIT/ML ~~LOC~~ SOLN: 0.0000 [IU] | Freq: Every day | SUBCUTANEOUS | Status: DC

## 2018-09-22 MED ORDER — LEVALBUTEROL HCL 0.63 MG/3ML IN NEBU
0.6300 mg | INHALATION_SOLUTION | Freq: Three times a day (TID) | RESPIRATORY_TRACT | Status: DC
  Administered 2018-09-23 – 2018-09-24 (×6): 0.63 mg via RESPIRATORY_TRACT
  Filled 2018-09-22 (×6): qty 3

## 2018-09-22 MED ORDER — POTASSIUM CHLORIDE CRYS ER 20 MEQ PO TBCR
40.0000 meq | EXTENDED_RELEASE_TABLET | Freq: Two times a day (BID) | ORAL | Status: AC
  Administered 2018-09-22 (×2): 40 meq via ORAL
  Filled 2018-09-22 (×2): qty 2

## 2018-09-22 MED ORDER — INSULIN ASPART 100 UNIT/ML ~~LOC~~ SOLN
0.0000 [IU] | Freq: Three times a day (TID) | SUBCUTANEOUS | Status: DC
  Administered 2018-09-23 – 2018-09-25 (×4): 1 [IU] via SUBCUTANEOUS

## 2018-09-22 MED ORDER — WARFARIN SODIUM 5 MG PO TABS
5.0000 mg | ORAL_TABLET | Freq: Once | ORAL | Status: AC
  Administered 2018-09-22: 5 mg via ORAL
  Filled 2018-09-22: qty 1

## 2018-09-22 MED ORDER — IPRATROPIUM BROMIDE 0.02 % IN SOLN
0.5000 mg | Freq: Four times a day (QID) | RESPIRATORY_TRACT | Status: DC
  Administered 2018-09-22 (×2): 0.5 mg via RESPIRATORY_TRACT
  Filled 2018-09-22 (×2): qty 2.5

## 2018-09-22 MED ORDER — FUROSEMIDE 10 MG/ML IJ SOLN
40.0000 mg | Freq: Once | INTRAMUSCULAR | Status: AC
  Administered 2018-09-22: 40 mg via INTRAVENOUS
  Filled 2018-09-22: qty 4

## 2018-09-22 NOTE — Care Management Important Message (Signed)
Important Message  Patient Details  Name: Marc Owen MRN: [id number] Date of Birth: [birth year]   Medicare Important Message Given:  Yes     [name] 09/22/2018, 1:15 PM

## 2018-09-22 NOTE — Progress Notes (Signed)
ANTICOAGULATION CONSULT NOTE - Follow-Up Consult  Pharmacy Consult for Warfarin Indication: atrial fibrillation  Not on File  Patient Measurements: Height: 5\' 7"  (170.2 cm) Weight: 219 lb 5.7 oz (99.5 kg) IBW/kg (Calculated) : 66.1  Vital Signs: Temp: 98.2 F (36.8 C) (08/04 0537) Temp Source: Oral (08/04 0537) BP: 130/89 (08/04 0537) Pulse Rate: 80 (08/04 0537)  Labs: Recent Labs    09/20/18 0024 09/21/18 0619 09/22/18 0635  HGB 12.7* 12.4* 11.8*  HCT 39.0 37.9* 35.7*  PLT 244 237 218  LABPROT  --  27.4* 23.5*  INR  --  2.6* 2.1*  CREATININE 2.20* 2.15* 2.24*    Estimated Creatinine Clearance: 33.5 mL/min (A) (by C-G formula based on SCr of 2.24 mg/dL (H)).   Medical History: History reviewed. No pertinent past medical history.  Assessment: 30 YOM who presented on 8/1 with R-foot swelling/pain. The patient was on warfarin PTA for hx Afib. PTA dose was 2.5 mg daily EXCEPT for 5 mg on Mon/Fri. Warfarin held initially due to concern for a need for interventions, resumed 8/3 evening s/p aspiration by ortho. Pharmacy consulted to dose.   INR today is therapeutic however trended down (INR 2.1 << 2.6) likely due to missed dose on 8/2. Hgb/Hct slight drop - no bleeding noted. Will give higher dose today, then likely to resume PTA dosing tomorrow.  Goal of Therapy:  INR 2-3 Monitor platelets by anticoagulation protocol: Yes   Plan:  - Warfarin 5 mg x 1 dose at 1800 today - Daily PT/INR, CBC q72h - Will continue to monitor for any signs/symptoms of bleeding and will follow up with PT/INR in the a.m.   Thank you for allowing pharmacy to be a part of this patient's care.  Alycia Rossetti, PharmD, BCPS Clinical Pharmacist Clinical phone for 09/22/2018: [phone] 09/22/2018 9:15 AM   **Pharmacist phone directory can now be found on [email or web address] (PW [email or web address]).  Listed under [place].

## 2018-09-22 NOTE — Progress Notes (Signed)
PROGRESS NOTE    Marc Owen  DTH:[id number] DOB: May 13, 1945 DOA: 09/19/2018 PCP: Patient, No Pcp Per   Brief Narrative:  HPI per Dr. [name] on 09/19/2018 Marc Owen is a 73 y.o. male with medical history significant of type 2 diabetes, COPD, atrial fibrillation, coronary artery disease, morbid obesity who presented to the ER in [place] with complaint of right ankle pain swelling and tenderness.  He was suspected to have cellulitis and possibly septic arthritis of the right ankle joint.  He was hemodynamically is stable except for significant tenderness swelling of lab changes showing white count of 11.1 elevated ESR as well as x-ray showing nose evidence of osteomyelitis.  Due to lack of orthopedic surgeon in the hospital patient was transferred here so orthopedics can evaluate patient.  Dr. [name] from [facility] has accepted to see the patient.  Patient denied any fever or chills at the moment.  He has been unable to put weight on his feet.  He has been taking his medications for blood sugar as well as hypertension.  He has been accepted and will be admitted for medical service..  ED Course: In the outside ED temperature was 99 blood pressure 140/90 pulse 94 respirate 20 oxygen sat 94% room air.  His white count is 11.1 ESR 58 creatinine 2.12, right ankle films showed erosions but no osteomyelitis.  **Interim history Orthopedic surgery evaluated and do not feel that the patient's foot is infected given no erythema but they do feel that something underlying may be going on so they recommend an MRI of the foot to look for insufficiency fracture versus other causes for erosions and cystic appearing changes of the bone.  At this point orthopedics do not feel the need to aspirate the ankle there currently await MRI results and update the plan when MRI is completed.  Right now they recommending nonweightbearing on the right leg extremity.    MRI done shows a large ankle joint  effusion mild degenerative changes and no tibiotalar erosive destructive joint changes or marrow edema to suggest septic arthritis or inflammatory arthropathy there is also diffuse and fairly marked subcutaneous soft tissue swelling and edema and fluid of unclear etiology which could reflect a cellulitis which make the ankle joint effusion somewhat more worrisome there is no discrete drainable soft tissue abscess noted.  There is no stress fractures or AVN.  Because MRI was unrevealing we have ordered a lower extremity duplex to rule out DVT and this was negative.  Orthopedic surgery evaluated again today and recommended a joint aspiration and they aspirated the right ankle with aspirate sent for Gram stain and culture as well as cell count.  4 mL of bloody clear fluid was obtained and patient tolerated the procedure well.  Appreciate further Ortho evaluation recommendations.  Cultures from the aspirate showing no growth to date at less than 24 hours.  Patient's leg swelling was still there so his Lasix was increased however could not be given IV as his IV was just lost so we will change to p.o. on the interim awaiting for an IV placement.  Assessment & Plan:   Principal Problem:   Cellulitis of right ankle Active Problems:   DM (diabetes mellitus) (HCC)   Benign essential HTN   CAD (coronary artery disease)   COPD (chronic obstructive pulmonary disease) (HCC)   CKD (chronic kidney disease), stage III (HCC)   Chronic a-fib   Pain and swelling of ankle, right   Pain and swelling of lower  extremity, right  Right foot pain with concern for underlying infection with concern for cellulitis, essentially ruled out -Suspected septic arthritis initially but foot is not warm or erythematous so orthopedics feels like this is less likely infection.  -Orthopedics Dr. [name] consulted for further evaluation recommendation.   -He was initially initiated on Vancomycin and Zosyn; see changes below -Continue with  pain management.   -Continue to elevate the foot and orthopedics recommending nonweightbearing.   -Further imaging as recommended by orthopedics and they obtain an MRI of the ankle and foot -MRI of the ankle and foot showed "Large ankle joint effusion and mild degenerative changes. No tibiotalar erosions or destructive bony changes or marrow edema to suggest septic arthritis or inflammatory arthropathy. Cystic lesion in the base of the third metatarsal has nonaggressive MR and plain film features is likely a benign bone cyst or intraosseous ganglion. No stress fractures or AVN.  Diffuse and fairly marked subcutaneous soft tissue swelling/edema/fluid of uncertain etiology. It could reflect cellulitis which would make the ankle joint effusions somewhat more worrisome. No discrete drainable soft tissue abscess. Mild diffuse myositis." -Continued and had changed IV Zosyn to IV cefepime given concern for nephrotoxicity with vancomycin; no source of infection was found so we will stop the vancomycin as well as cefepime currently and Monitor off of Abx -WBC on admission was 10.6 and repeat was 9.4 with 9.5 today; patient has been afebrile for last 24 hours -Further care per orthopedic surgery and they initially recommending not aspirating the joint currently until MRI is resulted; since MRIs been done they are going to aspirate the joint of the right ankle and this has been sent off for culture Gram stain as well as cell count; cultures are growing nothing. -IV fluid hydration has now been stopped and patient's Lasix was doubled given that he just lost IV and cannot give an IV -Continue with further supportive care with pain control with 2 mg of IV morphine every 3 hours PRN for severe pain as well as antiemetics with Zofran 4 mg p.o./IV every 6 as needed nausea -He had some leg swelling so we obtained venous duplex to rule out DVT component of the swelling and this was negative -PT OT to evaluate and treat and  recommending skilled nursing facility  Atrial Fibrillation -Patient is on warfarin.   -INR was 3.1 prior to arrival and repeat this morning was 2.1 -We will consult pharmacy for Coumadin management and adjustment and will resume after his joint aspiration; patient did receive a dose of warfarin 5 mg tonight and continue to monitor with daily PT/INR -Since patient had joint aspiration will resume warfarin -Resumed home medications including diltiazem 120 mg p.o. daily along with metoprolol tartrate 100 g p.o. twice daily  Diabetes Mellitus Type II -Patient is hemoglobin A1c was 6.8 on admission -CBGs have been ranging from 84-1 23 -Currently holding his glipizide 2 mg p.o. daily -Resumed home gabapentin 300 mg p.o. twice daily for diabetic neuropathy -Continue with Resistant sliding scale insulin before meals and at bedtime -Continue monitor and trend CBGs and adjust insulin as necessary  Hypertension -Continue blood pressure control when pharmacy verifies his medications -Blood pressure this morning was 127/100 -Resumed home amlodipine 5 mg p.o. daily along with diltiazem 120 mg p.o. daily and metoprolol 100 mg p.o. twice daily  Coronary Artery Disease -Patient appears to be at baseline and not complaining of any chest pain. -Continue monitoring and resume home medications once verified by pharmacy -We resumed his  aspirin 81 mg p.o. daily, metoprolol 100 mg p.o. twice daily, and pravastatin 40 g p.o. nightly  AKI on Chronic kidney disease stage III, improving -BUN/creatinine was 25/2.20 on admission and repeat this morning was 27/2.15 -Continued gentle IV fluid hydration with normal saline rate of 50 mL's per hour now stopped -Restarted home Lasix at 40 mg p.o. daily and have doubled the dose given his continued leg swelling; wanted to give it IV but just lost his IV access so we will double it and then give IV in the a.m. -Avoid nephrotoxic medications, contrast dyes, as well as  hypertension -Continue monitor and trend renal function -continue with Cinacalcet at 30 mg p.o. twice daily with meals -Repeat CMP in a.m.  COPD -Slight Exacerbation; currently will need a walk screen prior to discharge as he is previously been on oxygen in the past and is now requiring 2 L -Started Xopenex and Atrovent for some mild wheezing -Continue with home regimen when Medications are verified by Pharmacy -Does not appear to be on any inhalers currently -May need some inhalers at discharge  Normocytic AnemiaAnemia likely of Chronic Kidney Disease -Patient's hemoglobin/hematocrit on admission was 12.7/31.0 now is 11.8/35.7 -Checked anemia panel showed an iron level of 28, U IBC of 300, TIBC of 328, saturation ratio of 9%, ferritin level 242, folate level 8.1, vitamin B12 level 184 -Continue to monitor for signs and symptoms of bleeding; currently no overt bleeding noted -Repeat CBC in the a.m.  Obesity -Estimated body mass index is 34.36 kg/m as calculated from the following:   Height as of this encounter: _0  (1.702 m).   Weight as of this encounter: 99.5 kg. -Weight Loss and Dietary Counseling given   Hypothyroidism -Check TSH -Resumed levothyroxine 175 mcg p.o. daily  GERD -Continue with pantoprazole 40 mg p.o. daily  Depression and Anxiety -Continue venlafaxine XR 150 g p.o. daily with breakfast  Gout -Continue allopurinol 200 mg p.o. twice daily  Hypokalemia -Patient's potassium this morning was 3.4 -Replete with p.o. potassium chloride -Continue monitor and replete as necessary -repeat CMP in a.m.  DVT prophylaxis: Heparin 5000 units subcu every 8h Code Status: FULL CODE Family Communication: No Family present at bedside  Disposition Plan: Likely SNF placement if patient is agreeable  Consultants:   Orthopedic Surgery Dr. [name]    Procedures: MRI of the Ankle and Foot  Right ankle aspiration and injection done by Hilbert Odor of orthopedics with  5 mL's of 0.5% lidocaine instilled  Antimicrobials:  Anti-infectives (From admission, onward)   Start     Dose/Rate Route Frequency Ordered Stop   09/22/18 0230  vancomycin (VANCOCIN) 1,500 mg in sodium chloride 0.9 % 500 mL IVPB  Status:  Discontinued     1,500 mg 250 mL/hr over 120 Minutes Intravenous Every 48 hours 09/21/18 1111 09/21/18 1628   09/20/18 1800  ceFEPIme (MAXIPIME) 2 g in sodium chloride 0.9 % 100 mL IVPB  Status:  Discontinued     2 g 200 mL/hr over 30 Minutes Intravenous Every 12 hours 09/20/18 1322 09/21/18 1628   09/20/18 0800  piperacillin-tazobactam (ZOSYN) IVPB 3.375 g  Status:  Discontinued     3.375 g 12.5 mL/hr over 240 Minutes Intravenous Every 8 hours 09/20/18 0131 09/20/18 1317   09/20/18 0145  vancomycin (VANCOCIN) 1,750 mg in sodium chloride 0.9 % 500 mL IVPB  Status:  Discontinued     1,750 mg 250 mL/hr over 120 Minutes Intravenous Every 48 hours 09/20/18 0131 09/21/18 1111  09/19/18 2300  piperacillin-tazobactam (ZOSYN) IVPB 3.375 g     3.375 g 100 mL/hr over 30 Minutes Intravenous  Once 09/19/18 2226 09/20/18 0046     Subjective: Seen and examined at bedside and states that he feels a little bit better but still complaining of some swelling.  No nausea or vomiting.  Able to touch his foot this time without causing pain.  No lightheadedness or dizziness but felt a little short of breath and was wearing oxygen.  States that he is has COPD and his primary care took him off oxygen and he states he feels a lot better with the.  No other concerns or complaints at this time.  Objective: Vitals:   09/22/18 1344 09/22/18 1659 09/22/18 2016 09/22/18 2020  BP:  126/83  (!) 122/91  Pulse:  75 88 82  Resp:  _0 Temp:  97.9 F (36.6 C)    TempSrc:  Oral    SpO2: 93% 95% 96% 100%  Weight:      Height:        Intake/Output Summary (Last 24 hours) at 09/22/2018 2147 Last data filed at 09/22/2018 1700 Gross per 24 hour  Intake 1640 ml  Output 2200 ml   Net -560 ml   Filed Weights   09/20/18 0041 09/21/18 2049  Weight: 99.3 kg 99.5 kg   Examination: Physical Exam:  Constitutional: Weak well-nourished, well-developed obese Caucasian male currently no acute distress but complaining of some mild shortness of breath and complaining of some mild back pain and left hip pain Eyes: Lids and conjunctive are normal.  Sclera neck ENMT: External ears nose appear normal; grossly normal hearing Neck: Appears supple no JVD Respiratory: Diminished auscultation bilaterally with slight expiratory wheezing.  No appreciable rales or rhonchi.  Patient not tachypneic or using accessory muscles to breathe but is now been placed on 2 L of supplemental oxygen via nasal cannula Cardiovascular: Irregularly irregular.  No appreciable murmurs, rubs, gallops.  Has 1+ lower extremity edema bilaterally but more so on the right compared to left Abdomen: Soft, nontender, distended second body habitus.  The sounds present GU: Deferred Musculoskeletal: Right foot is swollen and painful to palpation but not as much as yesterday.  Does have some warmth.  Left leg is also swollen but right knee is also tender today Skin: Skin is warm and dry no appreciable rashes or lesions on skin evaluation  Neurologic: Cranial nerves II through XII gross intact no appreciable focal deficits Psychiatric:.  Patient is awake, alert, oriented.  Slightly anxious appearing  Data Reviewed: I have personally reviewed following labs and imaging studies  CBC: Recent Labs  Lab 09/20/18 0024 09/21/18 0619 09/22/18 0635  WBC 10.6* 9.4 9.5  NEUTROABS  --  6.9 7.0  HGB 12.7* 12.4* 11.8*  HCT 39.0 37.9* 35.7*  MCV 96.3 95.5 95.5  PLT 244 237 829   Basic Metabolic Panel: Recent Labs  Lab 09/20/18 0024 09/21/18 0619 09/22/18 0635  NA 139 137 136  K 3.5 3.5 3.4*  CL 105 101 101  CO2 _1 GLUCOSE 134* 89 90  BUN 25* 27* 34*  CREATININE 2.20* 2.15* 2.24*  CALCIUM 9.5 9.7 9.7  MG   --  1.6* 2.0  PHOS  --  3.0 2.7   GFR: Estimated Creatinine Clearance: 33.5 mL/min (A) (by C-G formula based on SCr of 2.24 mg/dL (H)). Liver Function Tests: Recent Labs  Lab 09/20/18 0024 09/21/18 0619 09/22/18 0635  AST 13* 11*  13*  ALT _0 ALKPHOS 97 81 91  BILITOT 0.8 1.2 1.2  PROT 7.4 7.1 7.0  ALBUMIN 3.6 3.0* 2.8*   No results for input(s): LIPASE, AMYLASE in the last 168 hours. No results for input(s): AMMONIA in the last 168 hours. Coagulation Profile: Recent Labs  Lab 09/21/18 0619 09/22/18 0635  INR 2.6* 2.1*   Cardiac Enzymes: No results for input(s): CKTOTAL, CKMB, CKMBINDEX, TROPONINI in the last 168 hours. BNP (last 3 results) No results for input(s): PROBNP in the last 8760 hours. HbA1C: Recent Labs    09/20/18 0024  HGBA1C 6.8*   CBG: Recent Labs  Lab 09/21/18 2049 09/22/18 0653 09/22/18 1124 09/22/18 1621 09/22/18 2019  GLUCAP 103* 84 109* 122* 123*   Lipid Profile: No results for input(s): CHOL, HDL, LDLCALC, TRIG, CHOLHDL, LDLDIRECT in the last 72 hours. Thyroid Function Tests: Recent Labs    09/22/18 0635  TSH 1.363   Anemia Panel: Recent Labs    09/22/18 0635  VITAMINB12 184  FOLATE 8.1  FERRITIN 242  TIBC 328  IRON 28*  RETICCTPCT 1.7   Sepsis Labs: No results for input(s): PROCALCITON, LATICACIDVEN in the last 168 hours.  Recent Results (from the past 240 hour(s))  SARS CORONAVIRUS 2 Nasal Swab Aptima Multi Swab     Status: None   Collection Time: 09/19/18 11:28 PM   Specimen: Aptima Multi Swab; Nasal Swab  Result Value Ref Range Status   SARS Coronavirus 2 NEGATIVE NEGATIVE Final    Comment: (NOTE) SARS-CoV-2 target nucleic acids are NOT DETECTED. The SARS-CoV-2 RNA is generally detectable in upper and lower respiratory specimens during the acute phase of infection. Negative results do not preclude SARS-CoV-2 infection, do not rule out co-infections with other pathogens, and should not be used as the sole  basis for treatment or other patient management decisions. Negative results must be combined with clinical observations, patient history, and epidemiological information. The expected result is Negative. Fact Sheet for Patients: SugarRoll.be Fact Sheet for Healthcare Providers: https://www.woods-mathews.com/ This test is not yet approved or cleared by the [place] FDA and  has been authorized for detection and/or diagnosis of SARS-CoV-2 by FDA under an Emergency Use Authorization (EUA). This EUA will remain  in effect (meaning this test can be used) for the duration of the COVID-19 declaration under Section 56 4(b)(1) of the Act, 21 U.S.C. section 360bbb-3(b)(1), unless the authorization is terminated or revoked sooner. Performed at [facility], [place] [address], [place], [place] [address]   Body fluid culture     Status: None (Preliminary result)   Collection Time: 09/21/18 11:26 AM   Specimen: Ankle; Body Fluid  Result Value Ref Range Status   Specimen Description ANKLE  Final   Special Requests NONE  Final   Gram Stain   Final    FEW WBC PRESENT, PREDOMINANTLY PMN NO ORGANISMS SEEN    Culture   Final    NO GROWTH < 24 HOURS Performed at [facility], [address] [place], [place] [address]    Report Status PENDING  Incomplete    Radiology Studies: No results found. Scheduled Meds: . allopurinol  200 mg Oral BID  . amLODipine  5 mg Oral Daily  . aspirin EC  81 mg Oral Daily  . bupivacaine  10 mL Infiltration Once  . cinacalcet  30 mg Oral BID WC  . diltiazem  120 mg Oral Daily  . furosemide  40 mg Oral BID  .  gabapentin  300 mg Oral BID  . guaiFENesin  1,200 mg Oral BID  . insulin aspart  0-5 Units Subcutaneous QHS  . [START ON 09/23/2018] insulin aspart  0-9 Units Subcutaneous TID WC  . [START ON 09/23/2018] ipratropium  0.5 mg Nebulization TID  . [START ON 09/23/2018] levalbuterol  0.63 mg Nebulization TID  .  levothyroxine  175 mcg Oral Q0600  . metoprolol tartrate  100 mg Oral BID  . pantoprazole  40 mg Oral Daily  . potassium chloride  40 mEq Oral BID  . pravastatin  40 mg Oral QHS  . venlafaxine XR  150 mg Oral Q breakfast  . Warfarin - Pharmacist Dosing Inpatient   Does not apply q1800   Continuous Infusions:   LOS: 3 days   Kerney Elbe, DO [facility] PAGER is on [place]  If 7PM-7AM, please contact night-coverage [email or web address] Password [facility] 09/22/2018, 9:47 PM

## 2018-09-22 NOTE — Plan of Care (Signed)
  Problem: Activity: Goal: Risk for activity intolerance will decrease Outcome: Progressing   

## 2018-09-22 NOTE — Progress Notes (Signed)
Occupational Therapy Evaluation Patient Details Name: Marc Owen MRN: [id number] DOB: 1945-05-06 Today's Date: 09/22/2018    History of Present Illness Marc CapriceJames Cooter is a 73 y.o. male with medical history significant of type 2 diabetes, COPD, atrial fibrillation, coronary artery disease, morbid obesity who [place] the [place] in [place] with complaint of right ankle pain swelling and tenderness.  He was suspected to have cellulitis and possibly septic arthritis of the right ankle joint. Aspiration done; Per Ortho, will maintain NWB RLE   Clinical Impression   PTA, pt was living at home alone, and reports he was independent with ADL/IADL and functional mobility. Pt reports he was using his RW since the pain in his Rfoot started. Pt reports he was driving and grocery shopping. Pt currently minA-modA for ALD and minA-modA with functional mobility at RW level. Pt is WBAT able to tolerate TDWB this session. Due to decline in current level of function, pt would benefit from acute OT to address established goals to facilitate safe D/C to venue listed below. At this time, recommend SNF follow-up. Pt may progress to CIR level therapies. Will continue to follow acutely.     Follow Up Recommendations  SNF    Equipment Recommendations  3 in 1 bedside commode    Recommendations for Other Services PT consult     Precautions / Restrictions Precautions Precautions: Fall Restrictions Weight Bearing Restrictions: Yes Other Position/Activity Restrictions: WBAT      Mobility Bed Mobility Overal bed mobility: Needs Assistance Bed Mobility: Supine to Sit     Supine to sit: Min assist;HOB elevated     General bed mobility comments: minA to progress trunk to upright position  Transfers Overall transfer level: Needs assistance Equipment used: Rolling walker (2 wheeled) Transfers: Sit to/from [employer] CorporationStand;Stand Pivot Transfers Sit to Stand: Min assist Stand pivot transfers: Min assist;Mod assist       General transfer comment: minA with occassional modA for stability during stand pivot    Balance Overall balance assessment: Needs assistance Sitting-balance support: No upper extremity supported;Feet unsupported Sitting balance-Leahy Scale: Good     Standing balance support: Bilateral upper extremity supported Standing balance-Leahy Scale: Poor Standing balance comment: reliant on bilateral UE support                           ADL either performed or assessed with clinical judgement   ADL Overall ADL's : Needs assistance/impaired Eating/Feeding: Set up;Sitting Eating/Feeding Details (indicate cue type and reason): pt eating lunch upon arrival Grooming: Set up;Sitting   Upper Body Bathing: Set up;Sitting   Lower Body Bathing: Moderate assistance;Minimal assistance;Sit to/from stand   Upper Body Dressing : Set up;Sitting   Lower Body Dressing: Moderate assistance;Minimal assistance;Sit to/from stand Lower Body Dressing Details (indicate cue type and reason): minA for sit<>stand;modA to access LB;pt reports he does not like/use his sockaide because it stretches out socks Toilet Transfer: Minimal assistance;Moderate assistance;Stand-pivot Toilet Transfer Details (indicate cue type and reason): minA occassionally modA for stability to transfer from EOB to recliner         Functional mobility during ADLs: Minimal assistance;Moderate assistance;Rolling walker General ADL Comments: limited by pain     Vision Patient Visual Report: No change from baseline       Perception     Praxis      Pertinent Vitals/Pain Pain Assessment: 0-10 Pain Score: 6  Pain Location: L lowerback;RLE Pain Descriptors / Indicators: Sore;Grimacing;Guarding Pain Intervention(s): Limited activity within patient's tolerance;Monitored  during session;Repositioned     Hand Dominance Right   Extremity/Trunk Assessment Upper Extremity Assessment Upper Extremity Assessment: Overall WFL  for tasks assessed   Lower Extremity Assessment Lower Extremity Assessment: Defer to PT evaluation;RLE deficits/detail RLE Deficits / Details: notable increased swelling;WBAT, able to tolerate TDWB RLE: Unable to fully assess due to pain RLE Sensation: WNL   Cervical / Trunk Assessment Cervical / Trunk Assessment: Normal   Communication Communication Communication: No difficulties   Cognition Arousal/Alertness: Awake/alert Behavior During Therapy: WFL for tasks assessed/performed Overall Cognitive Status: Within Functional Limits for tasks assessed                                     General Comments  VSS throughout;pt appeared to hold breath during functional mobility, spO2 remained >94% on 2lnc. pt educated on not holding breath during tasks.     Exercises     Shoulder Instructions      Home Living Family/patient expects to be discharged to:: Private residence Living Arrangements: Alone Available Help at Discharge: Friend(s);Available PRN/intermittently Type of Home: House Home Access: Stairs to enter [employer] of Steps: 1   Home Layout: One level         Bathroom Toilet: Handicapped height Bathroom Accessibility: Yes How Accessible: Accessible via walker Home Equipment: [place] - 2 wheels   Additional Comments: reports he is getting a scooter from [place] soon      Prior Functioning/Environment Level of Independence: Independent        Comments: pt was driving and shopping;pt on supplemental O2 at baselin        OT Problem List: Decreased strength;Decreased activity tolerance;Impaired balance (sitting and/or standing);Decreased safety awareness;Decreased knowledge of use of DME or AE;Decreased knowledge of precautions;Pain;Increased edema      OT Treatment/Interventions: Self-care/ADL training;Therapeutic exercise;Energy conservation;DME and/or AE instruction;Patient/family education;Balance training    OT Goals(Current goals can  be found in the care plan section) Acute Rehab OT Goals Patient Stated Goal: to get stronger OT Goal Formulation: With patient Time For Goal Achievement: 10/06/18 Potential to Achieve Goals: Good ADL Goals Pt Will Perform Grooming: with modified independence;standing Pt Will Perform Upper Body Dressing: with modified independence;sitting Pt Will Perform Lower Body Dressing: with modified independence;sit to/from stand Pt Will Transfer to Toilet: with modified independence;ambulating Pt Will Perform Tub/Shower Transfer: with modified independence  OT Frequency: Min 2X/week   Barriers to D/C: Decreased caregiver support  pt lives alone       Co-evaluation              AM-PAC OT "6 Clicks" Daily Activity     Outcome Measure Help from another person eating meals?: A Little Help from another person taking care of personal grooming?: A Little Help from another person toileting, which includes using toliet, bedpan, or urinal?: A Lot Help from another person bathing (including washing, rinsing, drying)?: A Little Help from another person to put on and taking off regular upper body clothing?: A Little Help from another person to put on and taking off regular lower body clothing?: A Lot 6 Click Score: 16   End of Session Equipment Utilized During Treatment: Gait belt;Rolling walker;Oxygen Nurse Communication: Mobility status  Activity Tolerance: Patient tolerated treatment well;Patient limited by pain Patient left: in chair;with call bell/phone within reach;with chair alarm set  OT Visit Diagnosis: Unsteadiness on feet (R26.81);Other abnormalities of gait and mobility (R26.89);Muscle weakness (generalized) (M62.81);Pain Pain -  Right/Left: Right Pain - part of body: Leg;Ankle and joints of foot                Time: [id number] OT Time Calculation (min): 18 min Charges:  OT General Charges $OT Visit: 1 Visit OT Evaluation $OT Eval Moderate Complexity: 1 Mod  Diona Brownereresa Shivaan Tierno  OTR/L Acute Rehabilitation Services Office: [phone]   Rebeca Alerteresa J Nechuma Boven 09/22/2018, 12:41 PM

## 2018-09-22 NOTE — Plan of Care (Signed)
  Problem: Education: Goal: Knowledge of General Education information will improve Description Including pain rating scale, medication(s)/side effects and non-pharmacologic comfort measures Outcome: Progressing   

## 2018-09-22 NOTE — Progress Notes (Signed)
Inpatient Diabetes Program Recommendations  AACE/ADA: New Consensus Statement on Inpatient Glycemic Control (2015)  Target Ranges:  Prepandial:   less than 140 mg/dL      Peak postprandial:   less than 180 mg/dL (1-2 hours)      Critically ill patients:  140 - 180 mg/dL   Lab Results  Component Value Date   GLUCAP 84 09/22/2018   HGBA1C 6.8 (H) 09/20/2018    Review of Glycemic Control Results for RIDDIK, SENNA (MRN [id number]) as of 09/22/2018 09:58  Ref. Range 09/21/2018 06:55 09/21/2018 11:27 09/21/2018 16:22 09/21/2018 20:49 09/22/2018 06:53  Glucose-Capillary Latest Ref Range: 70 - 99 mg/dL 95 99 340 (H) 103 (H) 84   Diabetes history: DM 2 Outpatient Diabetes medications:  Glucotrol 10 mg bid Current orders for Inpatient glycemic control: Novolog resistant tid with meals and HS  Inpatient Diabetes Program Recommendations:    Please reduce Novolog correction to sensitive tid with meals and HS  Thanks  Adah Perl, RN, BC-ADM Inpatient Diabetes Coordinator Pager [phone] (8a-5p)

## 2018-09-22 NOTE — Evaluation (Signed)
Physical Therapy Evaluation Patient Details Name: Marc Owen MRN: [id number] DOB: 11/11/1945 Today's Date: 09/22/2018   History of Present Illness  Marc Owen is a 73 y.o. male with medical history significant of type 2 diabetes, COPD, atrial fibrillation, coronary artery disease, morbid obesity who presented to the ER in [place] with complaint of right ankle pain swelling and tenderness.  He was suspected to have cellulitis and possibly septic arthritis of the right ankle joint. Aspiration done; Per Ortho, will maintain NWB RLE  Clinical Impression   Pt admitted with above diagnosis. Pt currently with functional limitations due to the deficits listed below (see PT Problem List). At baseline, managing independently with assistive devices; Awaits a replacement scooter from the [place]; Presents to PT with decr functional mobility, pain limiting weight bearing RLE, which is significantly effecting his ability to trnasfer;  Pt will benefit from skilled PT to increase their independence and safety with mobility to allow discharge to the venue listed below.       Follow Up Recommendations SNF    Equipment Recommendations  Rolling walker with 5" wheels;Standard walker    Recommendations for Other Services       Precautions / Restrictions Precautions Precautions: Fall Restrictions Weight Bearing Restrictions: Yes Other Position/Activity Restrictions: WBAT      Mobility  Bed Mobility Overal bed mobility: Needs Assistance Bed Mobility: Sit to Supine     Supine to sit: Min assist;HOB elevated Sit to supine: Min assist   General bed mobility comments: Min assist to help LEs into bed  Transfers Overall transfer level: Needs assistance Equipment used: Rolling walker (2 wheeled) Transfers: Sit to/from [employer] Sit to Stand: Min assist Stand pivot transfers: Min assist;Mod assist       General transfer comment: minA with occassional modA for stability during  stand pivot  Ambulation/Gait             General Gait Details: Unable, limited by pain  Stairs            Wheelchair Mobility    Modified Rankin (Stroke Patients Only)       Balance Overall balance assessment: Needs assistance Sitting-balance support: No upper extremity supported;Feet unsupported Sitting balance-Leahy Scale: Good     Standing balance support: Bilateral upper extremity supported Standing balance-Leahy Scale: Poor Standing balance comment: reliant on bilateral UE support                             Pertinent Vitals/Pain Pain Assessment: Faces Pain Score: 6  Faces Pain Scale: Hurts even more Pain Location: L lowerback;RLE Pain Descriptors / Indicators: Sore;Grimacing;Guarding Pain Intervention(s): Monitored during session    Home Living Family/patient expects to be discharged to:: Private residence Living Arrangements: Alone Available Help at Discharge: Friend(s);Available PRN/intermittently Type of Home: House Home Access: Stairs to enter   [employer] of Steps: 1 Home Layout: One level Home Equipment: [employer] - 2 wheels Additional Comments: reports he is getting a scooter from [place] soon    Prior Function Level of Independence: Independent         Comments: pt was driving and shopping;pt on supplemental O2 at bas[place]     Hand Dominance   Dominant Hand: Right    Extremity/Trunk Assessment   Upper Extremity Assessment Upper Extremity Assessment: Defer to OT evaluation    Lower Extremity Assessment Lower Extremity Assessment: Generalized weakness;RLE deficits/detail RLE Deficits / Details: Grossly decr AROM and strength, limited by pain and  swelling; notable increased swelling;WBAT, able to tolerate TDWB RLE: Unable to fully assess due to pain RLE Sensation: WNL    Cervical / Trunk Assessment Cervical / Trunk Assessment: Normal  Communication   Communication: No difficulties  Cognition  Arousal/Alertness: Awake/alert Behavior During Therapy: WFL for tasks assessed/performed Overall Cognitive Status: Within Functional Limits for tasks assessed                                        General Comments General comments (skin integrity, edema, etc.): Sesison conducted on supplemental O2, his baseline    Exercises     Assessment/Plan    PT Assessment Patient needs continued PT services  PT Problem List Decreased strength;Decreased range of motion;Decreased activity tolerance;Decreased balance;Decreased mobility;Decreased coordination;Decreased cognition;Decreased knowledge of use of DME;Decreased safety awareness;Decreased knowledge of precautions;Pain       PT Treatment Interventions DME instruction;Gait training;Functional mobility training;Therapeutic activities;Therapeutic exercise;Balance training;Neuromuscular re-education;Patient/family education;Wheelchair mobility training    PT Goals (Current goals can be found in the Care Plan section)  Acute Rehab PT Goals Patient Stated Goal: to get stronger PT Goal Formulation: With patient Time For Goal Achievement: 10/06/18 Potential to Achieve Goals: Good    Frequency Min 2X/week   Barriers to discharge Decreased caregiver support      Co-evaluation               AM-PAC PT "6 Clicks" Mobility  Outcome Measure Help needed turning from your back to your side while in a flat bed without using bedrails?: A Little Help needed moving from lying on your back to sitting on the side of a flat bed without using bedrails?: A Little Help needed moving to and from a bed to a chair (including a wheelchair)?: A Little Help needed standing up from a chair using your arms (e.g., wheelchair or bedside chair)?: A Little Help needed to walk in hospital room?: A Lot Help needed climbing 3-5 steps with a railing? : Total 6 Click Score: 15    End of Session Equipment Utilized During Treatment: Gait  belt Activity Tolerance: Patient tolerated treatment well Patient left: in bed;with call bell/phone within reach;with bed alarm set Nurse Communication: Mobility status PT Visit Diagnosis: Unsteadiness on feet (R26.81);Other abnormalities of gait and mobility (R26.89);Pain;Muscle weakness (generalized) (M62.81) Pain - Right/Left: Right Pain - part of body: Ankle and joints of foot    Time: 1357-1415 PT Time Calculation (min) (ACUTE ONLY): 18 min   Charges:   PT Evaluation $PT Eval Moderate Complexity: 1 Mod          Van ClinesHolly Amauri Keefe, PT  Acute Rehabilitation Services Pager [phone] Office [phone]   Levi AlandHolly H Pragya Lofaso 09/22/2018, 3:44 PM

## 2018-09-22 NOTE — Discharge Instructions (Signed)

## 2018-09-23 ENCOUNTER — Inpatient Hospital Stay (HOSPITAL_COMMUNITY): Payer: Medicare Other

## 2018-09-23 LAB — PROTIME-INR
INR: 2.6 — ABNORMAL HIGH (ref 0.8–1.2)
Prothrombin Time: 27.3 seconds — ABNORMAL HIGH (ref 11.4–15.2)

## 2018-09-23 LAB — COMPREHENSIVE METABOLIC PANEL
ALT: 16 U/L (ref 0–44)
AST: 16 U/L (ref 15–41)
Albumin: 2.8 g/dL — ABNORMAL LOW (ref 3.5–5.0)
Alkaline Phosphatase: 100 U/L (ref 38–126)
Anion gap: 12 (ref 5–15)
BUN: 36 mg/dL — ABNORMAL HIGH (ref 8–23)
CO2: 24 mmol/L (ref 22–32)
Calcium: 9.6 mg/dL (ref 8.9–10.3)
Chloride: 98 mmol/L (ref 98–111)
Creatinine, Ser: 2.34 mg/dL — ABNORMAL HIGH (ref 0.61–1.24)
GFR calc Af Amer: 31 mL/min — ABNORMAL LOW (ref >60)
GFR calc non Af Amer: 27 mL/min — ABNORMAL LOW (ref >60)
Glucose, Bld: 128 mg/dL — ABNORMAL HIGH (ref 70–99)
Potassium: 3.6 mmol/L (ref 3.5–5.1)
Sodium: 134 mmol/L — ABNORMAL LOW (ref 135–145)
Total Bilirubin: 0.9 mg/dL (ref 0.3–1.2)
Total Protein: 7.1 g/dL (ref 6.5–8.1)

## 2018-09-23 LAB — CBC WITH DIFFERENTIAL/PLATELET
Abs Immature Granulocytes: 0.05 10*3/uL (ref 0.00–0.07)
Basophils Absolute: 0 10*3/uL (ref 0.0–0.1)
Basophils Relative: 0 %
Eosinophils Absolute: 0.2 10*3/uL (ref 0.0–0.5)
Eosinophils Relative: 2 %
HCT: 36.5 % — ABNORMAL LOW (ref 39.0–52.0)
Hemoglobin: 12.2 g/dL — ABNORMAL LOW (ref 13.0–17.0)
Immature Granulocytes: 1 %
Lymphocytes Relative: 12 %
Lymphs Abs: 1.2 10*3/uL (ref 0.7–4.0)
MCH: 31.2 pg (ref 26.0–34.0)
MCHC: 33.4 g/dL (ref 30.0–36.0)
MCV: 93.4 fL (ref 80.0–100.0)
Monocytes Absolute: 1.1 10*3/uL — ABNORMAL HIGH (ref 0.1–1.0)
Monocytes Relative: 11 %
Neutro Abs: 7.1 10*3/uL (ref 1.7–7.7)
Neutrophils Relative %: 74 %
Platelets: 271 10*3/uL (ref 150–400)
RBC: 3.91 MIL/uL — ABNORMAL LOW (ref 4.22–5.81)
RDW: 14.5 % (ref 11.5–15.5)
WBC: 9.6 10*3/uL (ref 4.0–10.5)
nRBC: 0 % (ref 0.0–0.2)

## 2018-09-23 LAB — GLUCOSE, CAPILLARY
Glucose-Capillary: 123 mg/dL — ABNORMAL HIGH (ref 70–99)
Glucose-Capillary: 123 mg/dL — ABNORMAL HIGH (ref 70–99)
Glucose-Capillary: 127 mg/dL — ABNORMAL HIGH (ref 70–99)
Glucose-Capillary: 138 mg/dL — ABNORMAL HIGH (ref 70–99)

## 2018-09-23 LAB — MAGNESIUM: Magnesium: 1.9 mg/dL (ref 1.7–2.4)

## 2018-09-23 LAB — PHOSPHORUS: Phosphorus: 2.8 mg/dL (ref 2.5–4.6)

## 2018-09-23 MED ORDER — WARFARIN SODIUM 2.5 MG PO TABS
2.5000 mg | ORAL_TABLET | Freq: Once | ORAL | Status: AC
  Administered 2018-09-23: 2.5 mg via ORAL
  Filled 2018-09-23: qty 1

## 2018-09-23 NOTE — Plan of Care (Signed)
  Problem: Education: Goal: Knowledge of General Education information will improve Description Including pain rating scale, medication(s)/side effects and non-pharmacologic comfort measures Outcome: Progressing   

## 2018-09-23 NOTE — Progress Notes (Signed)
PROGRESS NOTE    Marc Owen  ZOX:[id number] DOB: February 17, 1946 DOA: 09/19/2018 PCP: Patient, No Pcp Per   Brief Narrative:  HPI per Dr. [name] on 09/19/2018 Marc Owen is a 73 y.o. male with medical history significant of type 2 diabetes, COPD, atrial fibrillation, coronary artery disease, morbid obesity who presented to the ER in [place] with complaint of right ankle pain swelling and tenderness.  He was suspected to have cellulitis and possibly septic arthritis of the right ankle joint.  He was hemodynamically is stable except for significant tenderness swelling of lab changes showing white count of 11.1 elevated ESR as well as x-ray showing nose evidence of osteomyelitis.  Due to lack of orthopedic surgeon in the hospital patient was transferred here so orthopedics can evaluate patient.  Dr. [name] from [facility] has accepted to see the patient.  Patient denied any fever or chills at the moment.  He has been unable to put weight on his feet.  He has been taking his medications for blood sugar as well as hypertension.  He has been accepted and will be admitted for medical service..   Assessment & Plan:   Principal Problem:   Cellulitis of right ankle Active Problems:   DM (diabetes mellitus) (HCC)   Benign essential HTN   CAD (coronary artery disease)   COPD (chronic obstructive pulmonary disease) (HCC)   CKD (chronic kidney disease), stage III (HCC)   Chronic a-fib   Pain and swelling of ankle, right   Pain and swelling of lower extremity, right  Right foot pain with concern for underlying infection with concern for cellulitis, essentially ruled out Currently afebrile, no leukocytosis MRI of the ankle and foot showed "Large ankle joint effusion and mild degenerative changes. No tibiotalar erosions or destructive bony changes or marrow edema to suggest septic arthritis or inflammatory arthropathy. No stress fractures or AVN.  Diffuse and fairly marked subcutaneous  soft tissue swelling/edema/fluid of uncertain etiology. It could reflect cellulitis which would make the ankle joint effusions somewhat more worrisome. No discrete drainable soft tissue abscess. Mild diffuse myositis." Currently off of antibiotics, as infection is highly unlikely  Joint aspirate done with Gram stain as well as cell count unremarkable, cultures are growing nothing. Doppler negative for DVT PT OT to evaluate and treat and recommending skilled nursing facility  Atrial Fibrillation Rate controlled Continue diltiazem, metoprolol Coumadin for anticoagulation  Diabetes Mellitus Type II Patient is hemoglobin A1c was 6.8 on admission SSI, Accu-Cheks, hypoglycemic protocol Currently holding his glipizide 2 mg p.o. daily Resumed home gabapentin 300 mg p.o. twice daily for diabetic neuropathy  Hypertension Stable Resumed home amlodipine 5 mg p.o. daily along with diltiazem 120 mg p.o. daily and metoprolol 100 mg p.o. twice daily  Coronary Artery Disease Chest pain-free Continue aspirin 81 mg p.o. daily, metoprolol 100 mg p.o. twice daily, and pravastatin 40 g p.o. nightly  AKI on Chronic kidney disease stage III Ongoing BUN/creatinine was 25/2.20 on admission Status post IV fluids Due to leg swelling, Lasix was restarted We will monitor very closely Daily BMP  COPD Continues to require supplemental oxygen, plan to wean, if unable, will assess for home O2 eval Continue nebulizations Plan to DC on some inhalers  Anemia likely of Chronic Kidney Disease Stable Anemia panel showed an iron level of 28, U IBC of 300, TIBC of 328, saturation ratio of 9%, ferritin level 242, folate level 8.1, vitamin B12 level 184 Daily CBC  Obesity -Estimated body mass index is 34.36 kg/m  as calculated from the following:   Height as of this encounter: '5\' 7"'  (1.702 m).   Weight as of this encounter: 99.5 kg. -Weight Loss and Dietary Counseling given   Hypothyroidism Continue  levothyroxine 175 mcg p.o. daily  GERD Continue with pantoprazole 40 mg p.o. daily  Depression and Anxiety Continue venlafaxine XR 150 g p.o. daily with breakfast  Gout Continue allopurinol 200 mg p.o. twice daily   DVT prophylaxis: Heparin 5000 units subcu every 8h Code Status: FULL CODE Family Communication: No Family present at bedside  Disposition Plan: Likely SNF placement if patient is agreeable  Consultants:   Orthopedic Surgery Dr. [name]    Procedures: MRI of the Ankle and Foot  Right ankle aspiration and injection done by [facility]or of orthopedics with 5 mL's of 0.5% lidocaine instilled  Antimicrobials:  Anti-infectives (From admission, onward)   Start     Dose/Rate Route Frequency Ordered Stop   09/22/18 0230  vancomycin (VANCOCIN) 1,500 mg in sodium chloride 0.9 % 500 mL IVPB  Status:  Discontinued     1,500 mg 250 mL/hr over 120 Minutes Intravenous Every 48 hours 09/21/18 1111 09/21/18 1628   09/20/18 1800  ceFEPIme (MAXIPIME) 2 g in sodium chloride 0.9 % 100 mL IVPB  Status:  Discontinued     2 g 200 mL/hr over 30 Minutes Intravenous Every 12 hours 09/20/18 1322 09/21/18 1628   09/20/18 0800  piperacillin-tazobactam (ZOSYN) IVPB 3.375 g  Status:  Discontinued     3.375 g 12.5 mL/hr over 240 Minutes Intravenous Every 8 hours 09/20/18 0131 09/20/18 1317   09/20/18 0145  vancomycin (VANCOCIN) 1,750 mg in sodium chloride 0.9 % 500 mL IVPB  Status:  Discontinued     1,750 mg 250 mL/hr over 120 Minutes Intravenous Every 48 hours 09/20/18 0131 09/21/18 1111   09/19/18 2300  piperacillin-tazobactam (ZOSYN) IVPB 3.375 g     3.375 g 100 mL/hr over 30 Minutes Intravenous  Once 09/19/18 2226 09/20/18 0046     Subjective: Patient seen and examined at bedside, reports he did not sleep well at night.  Denies any new complaints.  Objective: Vitals:   09/23/18 0820 09/23/18 0900 09/23/18 1446 09/23/18 1447  BP:  139/85    Pulse: 97 86    Resp: 18 20    Temp:   97.8 F (36.6 C)    TempSrc:  Oral    SpO2: 91% 95% 99% 99%  Weight:      Height:        Intake/Output Summary (Last 24 hours) at 09/23/2018 1900 Last data filed at 09/23/2018 1847 Gross per 24 hour  Intake 940 ml  Output 4700 ml  Net -3760 ml   Filed Weights   09/20/18 0041 09/21/18 2049  Weight: 99.3 kg 99.5 kg   Examination: Physical Exam:  General: NAD   Cardiovascular: S1, S2 present  Respiratory: CTAB  Abdomen: Soft, nontender, nondistended, bowel sounds present  Musculoskeletal: Significant right foot edema  Skin: Normal  Psychiatry: Normal mood     Data Reviewed: I have personally reviewed following labs and imaging studies  CBC: Recent Labs  Lab 09/20/18 0024 09/21/18 0619 09/22/18 0635 09/23/18 0513  WBC 10.6* 9.4 9.5 9.6  NEUTROABS  --  6.9 7.0 7.1  HGB 12.7* 12.4* 11.8* 12.2*  HCT 39.0 37.9* 35.7* 36.5*  MCV 96.3 95.5 95.5 93.4  PLT 244 237 218 812   Basic Metabolic Panel: Recent Labs  Lab 09/20/18 0024 09/21/18 7517 09/22/18 0017 09/23/18 4944  NA 139 137 136 134*  K 3.5 3.5 3.4* 3.6  CL 105 101 101 98  CO2 '28 24 22 24  ' GLUCOSE 134* 89 90 128*  BUN 25* 27* 34* 36*  CREATININE 2.20* 2.15* 2.24* 2.34*  CALCIUM 9.5 9.7 9.7 9.6  MG  --  1.6* 2.0 1.9  PHOS  --  3.0 2.7 2.8   GFR: Estimated Creatinine Clearance: 32.1 mL/min (A) (by C-G formula based on SCr of 2.34 mg/dL (H)). Liver Function Tests: Recent Labs  Lab 09/20/18 0024 09/21/18 0619 09/22/18 0635 09/23/18 0513  AST 13* 11* 13* 16  ALT '14 12 14 16  ' ALKPHOS 97 81 91 100  BILITOT 0.8 1.2 1.2 0.9  PROT 7.4 7.1 7.0 7.1  ALBUMIN 3.6 3.0* 2.8* 2.8*   No results for input(s): LIPASE, AMYLASE in the last 168 hours. No results for input(s): AMMONIA in the last 168 hours. Coagulation Profile: Recent Labs  Lab 09/21/18 0619 09/22/18 0635 09/23/18 0513  INR 2.6* 2.1* 2.6*   Cardiac Enzymes: No results for input(s): CKTOTAL, CKMB, CKMBINDEX, TROPONINI in the last 168  hours. BNP (last 3 results) No results for input(s): PROBNP in the last 8760 hours. HbA1C: No results for input(s): HGBA1C in the last 72 hours. CBG: Recent Labs  Lab 09/22/18 1621 09/22/18 2019 09/23/18 0651 09/23/18 1114 09/23/18 1636  GLUCAP 122* 123* 123* 123* 127*   Lipid Profile: No results for input(s): CHOL, HDL, LDLCALC, TRIG, CHOLHDL, LDLDIRECT in the last 72 hours. Thyroid Function Tests: Recent Labs    09/22/18 0635  TSH 1.363   Anemia Panel: Recent Labs    09/22/18 0635  VITAMINB12 184  FOLATE 8.1  FERRITIN 242  TIBC 328  IRON 28*  RETICCTPCT 1.7   Sepsis Labs: No results for input(s): PROCALCITON, LATICACIDVEN in the last 168 hours.  Recent Results (from the past 240 hour(s))  SARS CORONAVIRUS 2 Nasal Swab Aptima Multi Swab     Status: None   Collection Time: 09/19/18 11:28 PM   Specimen: Aptima Multi Swab; Nasal Swab  Result Value Ref Range Status   SARS Coronavirus 2 NEGATIVE NEGATIVE Final    Comment: (NOTE) SARS-CoV-2 target nucleic acids are NOT DETECTED. The SARS-CoV-2 RNA is generally detectable in upper and lower respiratory specimens during the acute phase of infection. Negative results do not preclude SARS-CoV-2 infection, do not rule out co-infections with other pathogens, and should not be used as the sole basis for treatment or other patient management decisions. Negative results must be combined with clinical observations, patient history, and epidemiological information. The expected result is Negative. Fact Sheet for Patients: SugarRoll.be Fact Sheet for Healthcare Providers: https://www.woods-mathews.com/ This test is not yet approved or cleared by the [place] FDA and  has been authorized for detection and/or diagnosis of SARS-CoV-2 by FDA under an Emergency Use Authorization (EUA). This EUA will remain  in effect (meaning this test can be used) for the duration of the COVID-19  declaration under Section 56 4(b)(1) of the Act, 21 U.S.C. section 360bbb-3(b)(1), unless the authorization is terminated or revoked sooner. Performed at [facility], [place] [address] [place], [place] [address]   Body fluid culture     Status: None (Preliminary result)   Collection Time: 09/21/18 11:26 AM   Specimen: Ankle; Body Fluid  Result Value Ref Range Status   Specimen Description ANKLE  Final   Special Requests NONE  Final   Gram Stain   Final    FEW WBC PRESENT,  PREDOMINANTLY PMN NO ORGANISMS SEEN    Culture   Final    NO GROWTH 2 DAYS Performed at [facility], [place] [address], [place], [place] [address]    Report Status PENDING  Incomplete    Radiology Studies: Dg Chest Port 1 View  Result Date: 09/23/2018 CLINICAL DATA:  Shortness of breath.  07/08/2017. EXAM: PORTABLE CHEST 1 VIEW FINDINGS: Mediastinum and hilar structures normal. Stable cardiomegaly. Low lung. Mild left base subsegmental axis and or scarring. Stable prior exam. Filtrate noted. No pleural effusion or pneumothorax. IMPRESSION: 1. Cardiomegaly. No pulmonary venous congestion. 2. Low lung volumes. Mild left base subsegmental atelectasis/scarring, no change from prior exam. No acute pulmonary infiltrate noted. Electronically Signed   By: Marcello Moores  Register   On: 09/23/2018 08:47   Scheduled Meds: . allopurinol  200 mg Oral BID  . amLODipine  5 mg Oral Daily  . aspirin EC  81 mg Oral Daily  . bupivacaine  10 mL Infiltration Once  . cinacalcet  30 mg Oral BID WC  . diltiazem  120 mg Oral Daily  . furosemide  40 mg Oral BID  . gabapentin  300 mg Oral BID  . guaiFENesin  1,200 mg Oral BID  . insulin aspart  0-5 Units Subcutaneous QHS  . insulin aspart  0-9 Units Subcutaneous TID WC  . ipratropium  0.5 mg Nebulization TID  . levalbuterol  0.63 mg Nebulization TID  . levothyroxine  175 mcg Oral Q0600  . metoprolol tartrate  100 mg Oral BID  . pantoprazole  40 mg Oral Daily  . pravastatin  40 mg  Oral QHS  . venlafaxine XR  150 mg Oral Q breakfast  . Warfarin - Pharmacist Dosing Inpatient   Does not apply q1800   Continuous Infusions:   LOS: 4 days   Alma Friendly, MD [facility] PAGER is on [place]  If 7PM-7AM, please contact night-coverage [email or web address] Password [email or web address] 09/23/2018, 7:00 PM

## 2018-09-23 NOTE — Plan of Care (Signed)
  Problem: Pain Managment: Goal: General experience of comfort will improve Outcome: Progressing   

## 2018-09-23 NOTE — Progress Notes (Signed)
ANTICOAGULATION CONSULT NOTE - Follow-Up Consult  Pharmacy Consult for Warfarin Indication: atrial fibrillation  Not on File  Patient Measurements: Height: 5\' 7"  (170.2 cm) Weight: 219 lb 5.7 oz (99.5 kg) IBW/kg (Calculated) : 66.1  Vital Signs: Temp: 97.8 F (36.6 C) (08/05 0540) Temp Source: Oral (08/05 0540) BP: 131/92 (08/05 0540) Pulse Rate: 97 (08/05 0820)  Labs: Recent Labs    09/21/18 0619 09/22/18 0635 09/23/18 0513  HGB 12.4* 11.8* 12.2*  HCT 37.9* 35.7* 36.5*  PLT 237 218 271  LABPROT 27.4* 23.5* 27.3*  INR 2.6* 2.1* 2.6*  CREATININE 2.15* 2.24* 2.34*    Estimated Creatinine Clearance: 32.1 mL/min (A) (by C-G formula based on SCr of 2.34 mg/dL (H)).   Medical History: History reviewed. No pertinent past medical history.  Assessment: 72 YOM who presented on 8/1 with R-foot swelling/pain. The patient was on warfarin PTA for hx Afib. PTA dose was 2.5 mg daily EXCEPT for 5 mg on Mon/Fri. Warfarin held initially due to concern for a need for interventions, resumed 8/3 evening s/p aspiration by ortho. Pharmacy consulted to dose.   INR today remains therapeutic (INR 2.6 << 2.1). Hgb/Hct stable. No bleeding noted.  Goal of Therapy:  INR 2-3 Monitor platelets by anticoagulation protocol: Yes   Plan:  - Warfarin 2.5 mg x 1 dose at 1800 today - Daily PT/INR, CBC q72h - Will continue to monitor for any signs/symptoms of bleeding and will follow up with PT/INR in the a.m.   Thank you for allowing pharmacy to be a part of this patient's care.  Alycia Rossetti, PharmD, BCPS Clinical Pharmacist Clinical phone for 09/23/2018: [phone] 09/23/2018 9:01 AM   **Pharmacist phone directory can now be found on [email or web address] ([email or web address]).  Listed under [place].

## 2018-09-24 LAB — CBC WITH DIFFERENTIAL/PLATELET
Abs Immature Granulocytes: 0.05 10*3/uL (ref 0.00–0.07)
Basophils Absolute: 0.1 10*3/uL (ref 0.0–0.1)
Basophils Relative: 1 %
Eosinophils Absolute: 0.4 10*3/uL (ref 0.0–0.5)
Eosinophils Relative: 4 %
HCT: 39.4 % (ref 39.0–52.0)
Hemoglobin: 13 g/dL (ref 13.0–17.0)
Immature Granulocytes: 1 %
Lymphocytes Relative: 14 %
Lymphs Abs: 1.4 10*3/uL (ref 0.7–4.0)
MCH: 31.4 pg (ref 26.0–34.0)
MCHC: 33 g/dL (ref 30.0–36.0)
MCV: 95.2 fL (ref 80.0–100.0)
Monocytes Absolute: 1.1 10*3/uL — ABNORMAL HIGH (ref 0.1–1.0)
Monocytes Relative: 11 %
Neutro Abs: 6.8 10*3/uL (ref 1.7–7.7)
Neutrophils Relative %: 69 %
Platelets: 308 10*3/uL (ref 150–400)
RBC: 4.14 MIL/uL — ABNORMAL LOW (ref 4.22–5.81)
RDW: 14.3 % (ref 11.5–15.5)
WBC: 9.8 10*3/uL (ref 4.0–10.5)
nRBC: 0 % (ref 0.0–0.2)

## 2018-09-24 LAB — BASIC METABOLIC PANEL
Anion gap: 14 (ref 5–15)
BUN: 42 mg/dL — ABNORMAL HIGH (ref 8–23)
CO2: 26 mmol/L (ref 22–32)
Calcium: 10.6 mg/dL — ABNORMAL HIGH (ref 8.9–10.3)
Chloride: 95 mmol/L — ABNORMAL LOW (ref 98–111)
Creatinine, Ser: 2.18 mg/dL — ABNORMAL HIGH (ref 0.61–1.24)
GFR calc Af Amer: 34 mL/min — ABNORMAL LOW (ref >60)
GFR calc non Af Amer: 29 mL/min — ABNORMAL LOW (ref >60)
Glucose, Bld: 113 mg/dL — ABNORMAL HIGH (ref 70–99)
Potassium: 4 mmol/L (ref 3.5–5.1)
Sodium: 135 mmol/L (ref 135–145)

## 2018-09-24 LAB — BODY FLUID CULTURE: Culture: NO GROWTH

## 2018-09-24 LAB — GLUCOSE, CAPILLARY
Glucose-Capillary: 98 mg/dL (ref 70–99)
Glucose-Capillary: 108 mg/dL — ABNORMAL HIGH (ref 70–99)
Glucose-Capillary: 143 mg/dL — ABNORMAL HIGH (ref 70–99)
Glucose-Capillary: 116 mg/dL — ABNORMAL HIGH (ref 70–99)

## 2018-09-24 LAB — PROTIME-INR
INR: 2.3 — ABNORMAL HIGH (ref 0.8–1.2)
Prothrombin Time: 24.7 seconds — ABNORMAL HIGH (ref 11.4–15.2)

## 2018-09-24 MED ORDER — IPRATROPIUM BROMIDE 0.02 % IN SOLN
0.5000 mg | Freq: Two times a day (BID) | RESPIRATORY_TRACT | Status: DC
  Administered 2018-09-25: 0.5 mg via RESPIRATORY_TRACT
  Filled 2018-09-24: qty 2.5

## 2018-09-24 MED ORDER — WARFARIN SODIUM 2.5 MG PO TABS
2.5000 mg | ORAL_TABLET | Freq: Once | ORAL | Status: AC
  Administered 2018-09-24: 2.5 mg via ORAL
  Filled 2018-09-24: qty 1

## 2018-09-24 MED ORDER — LEVALBUTEROL HCL 0.63 MG/3ML IN NEBU
0.6300 mg | INHALATION_SOLUTION | Freq: Two times a day (BID) | RESPIRATORY_TRACT | Status: DC
  Administered 2018-09-25: 0.63 mg via RESPIRATORY_TRACT
  Filled 2018-09-24: qty 3

## 2018-09-24 NOTE — Plan of Care (Signed)
  Problem: Education: Goal: Knowledge of General Education information will improve Description: Including pain rating scale, medication(s)/side effects and non-pharmacologic comfort measures Outcome: Progressing   Problem: Health Behavior/Discharge Planning: Goal: Ability to manage health-related needs will improve Outcome: Progressing   Problem: Activity: Goal: Risk for activity intolerance will decrease Outcome: Progressing   

## 2018-09-24 NOTE — Progress Notes (Signed)
Occupational Therapy Treatment Patient Details Name: Marc Owen MRN: [id number] DOB: 07/31/1945 Today's Date: 09/24/2018    History of present illness Marc Owen is a 73 y.o. male with medical history significant of type 2 diabetes, COPD, atrial fibrillation, coronary artery disease, morbid obesity who presented to the ER in [place] with complaint of right ankle pain swelling and tenderness.  He was suspected to have cellulitis and possibly septic arthritis of the right ankle joint. Aspiration done; Per Ortho, will maintain NWB RLE   OT comments  Pt progressing towards acute OT goals. Focus of session was simulated toilet transfer and grooming task standing at sink. Min A for functional transfers and mobility. D/c plan remains appropriate.    Follow Up Recommendations  SNF    Equipment Recommendations  3 in 1 bedside commode    Recommendations for Other Services      Precautions / Restrictions Precautions Precautions: Fall Restrictions Weight Bearing Restrictions: Yes Other Position/Activity Restrictions: WBAT       Mobility Bed Mobility               General bed mobility comments: sitting EOB upon OT arrival  Transfers Overall transfer level: Needs assistance Equipment used: Rolling walker (2 wheeled) Transfers: Sit to/from Stand Sit to Stand: Min assist         General transfer comment: min A to steady, to/from EOB    Balance Overall balance assessment: Needs assistance Sitting-balance support: No upper extremity supported;Feet unsupported Sitting balance-Leahy Scale: Good     Standing balance support: Bilateral upper extremity supported Standing balance-Leahy Scale: Poor Standing balance comment: reliant on bilateral UE support                           ADL either performed or assessed with clinical judgement   ADL Overall ADL's : Needs assistance/impaired Eating/Feeding: Set up;Sitting   Grooming: Min guard;Standing;Wash/dry  hands Grooming Details (indicate cue type and reason): external support of sink utilized                 StatisticianToilet Transfer: Minimal assistance;Ambulation;RW           Functional mobility during ADLs: Minimal assistance;Rolling walker General ADL Comments: Pt completed in room functinoal mobility, simulated toilet transfer and 1 grooming task standing at sink     Vision       Perception     Praxis      Cognition Arousal/Alertness: Awake/alert Behavior During Therapy: WFL for tasks assessed/performed Overall Cognitive Status: Within Functional Limits for tasks assessed                                          Exercises     Shoulder Instructions       General Comments      Pertinent Vitals/ Pain       Pain Assessment: 0-10 Pain Score: 4  Pain Location: L lowerback;RLE Pain Descriptors / Indicators: Sore;Grimacing;Guarding Pain Intervention(s): Monitored during session;Repositioned;Limited activity within patient's tolerance  Home Living                                          Prior Functioning/Environment              Frequency  Min 2X/week  Progress Toward Goals  OT Goals(current goals can now be found in the care plan section)  Progress towards OT goals: Progressing toward goals  Acute Rehab OT Goals Patient Stated Goal: to get stronger OT Goal Formulation: With patient Time For Goal Achievement: 10/06/18 Potential to Achieve Goals: Good ADL Goals Pt Will Perform Grooming: with modified independence;standing Pt Will Perform Upper Body Dressing: with modified independence;sitting Pt Will Perform Lower Body Dressing: with modified independence;sit to/from stand Pt Will Transfer to Toilet: with modified independence;ambulating Pt Will Perform Tub/Shower Transfer: with modified independence  Plan Discharge plan remains appropriate    Co-evaluation                 AM-PAC OT "6 Clicks" Daily  Activity     Outcome Measure   Help from another person eating meals?: A Little Help from another person taking care of personal grooming?: A Little Help from another person toileting, which includes using toliet, bedpan, or urinal?: A Little Help from another person bathing (including washing, rinsing, drying)?: A Little Help from another person to put on and taking off regular upper body clothing?: A Little Help from another person to put on and taking off regular lower body clothing?: A Lot 6 Click Score: 17    End of Session Equipment Utilized During Treatment: Rolling walker  OT Visit Diagnosis: Unsteadiness on feet (R26.81);Other abnormalities of gait and mobility (R26.89);Muscle weakness (generalized) (M62.81);Pain Pain - Right/Left: Right Pain - part of body: Leg;Ankle and joints of foot   Activity Tolerance Patient tolerated treatment well;Patient limited by pain   Patient Left in bed;with call bell/phone within reach;Other (comment)(sitting EOB to eat lunch)   Nurse Communication          Time: 6629-4765 OT Time Calculation (min): 12 min  Charges: OT General Charges $OT Visit: 1 Visit OT Treatments $Self Care/Home Management : 8-22 mins  Tyrone Schimke, OT Acute Rehabilitation Services Pager: [phone] Office: [phone]    Hortencia Pilar 09/24/2018, 11:51 AM

## 2018-09-24 NOTE — Progress Notes (Signed)
Physical Therapy Treatment Patient Details Name: Marc Owen MRN: [id number] DOB: [birth year] Today's Date: 09/24/2018    History of Present Illness Marc Owen is a 73 y.o. male with medical history significant of type 2 diabetes, COPD, atrial fibrillation, coronary artery disease, morbid obesity who [place] the [place] in [place] with complaint of right ankle pain swelling and tenderness.  He was suspected to have cellulitis and possibly septic arthritis of the right ankle joint. Aspiration done; Per Ortho, will maintain NWB RLE    PT Comments    Patient progressing well with therapy today, now ambulating hallway distances with RW. Pt at this time with poor compliance with NWB, instead he insists on PWB with <25% WB on RLE. Per chart he is to be NWB, educated on this. I do feel he is progressing past the benefit of sub acute rehab and could aim to achieve HHPT with subsequent sessions.      Follow Up Recommendations  Home health PT     Equipment Recommendations  Rolling walker with 5" wheels;Standard walker(Tub Bench)    Recommendations for Other Services       Precautions / Restrictions Precautions Precautions: Fall Restrictions Weight Bearing Restrictions: Yes Other Position/Activity Restrictions: WBAT    Mobility  Bed Mobility               General bed mobility comments: sitting EOB upon OT arrival  Transfers Overall transfer level: Needs assistance Equipment used: Rolling walker (2 wheeled) Transfers: Sit to/from Stand Sit to Stand: Supervision         General transfer comment: S for transfers  Ambulation/Gait Ambulation/Gait assistance: Supervision Gait Distance (Feet): 80 Feet Assistive device: Rolling walker (2 wheeled) Gait Pattern/deviations: Step-to pattern Gait velocity: decreased   General Gait Details: cues for NWB, patient unable to do without PWB, light pressure use of BUE on RW, no balance or stability concerns    Stairs              Wheelchair Mobility    Modified Rankin (Stroke Patients Only)       Balance Overall balance assessment: Needs assistance Sitting-balance support: No upper extremity supported;Feet unsupported Sitting balance-Leahy Scale: Good     Standing balance support: Bilateral upper extremity supported Standing balance-Leahy Scale: Poor Standing balance comment: reliant on bilateral UE support                            Cognition Arousal/Alertness: Awake/alert Behavior During Therapy: WFL for tasks assessed/performed Overall Cognitive Status: Within Functional Limits for tasks assessed                                        Exercises      General Comments        Pertinent Vitals/Pain Pain Assessment: 0-10 Pain Score: 4  Pain Location: L lowerback;RLE Pain Descriptors / Indicators: Sore;Grimacing;Guarding    Home Living                      Prior Function            PT Goals (current goals can now be found in the care plan section) Acute Rehab PT Goals Patient Stated Goal: to get stronger PT Goal Formulation: With patient Time For Goal Achievement: 10/06/18 Potential to Achieve Goals: Good Progress towards PT goals: Progressing toward goals  Frequency    Min 3X/week      PT Plan Current plan remains appropriate    Co-evaluation              AM-PAC PT "6 Clicks" Mobility   Outcome Measure  Help needed turning from your back to your side while in a flat bed without using bedrails?: None Help needed moving from lying on your back to sitting on the side of a flat bed without using bedrails?: None Help needed moving to and from a bed to a chair (including a wheelchair)?: None Help needed standing up from a chair using your arms (e.g., wheelchair or bedside chair)?: A Little Help needed to walk in hospital room?: A Little Help needed climbing 3-5 steps with a railing? : A Little 6 Click Score: 21    End of  Session Equipment Utilized During Treatment: Gait belt Activity Tolerance: Patient tolerated treatment well Patient left: in bed;with call bell/phone within reach;with bed alarm set Nurse Communication: Mobility status PT Visit Diagnosis: Unsteadiness on feet (R26.81);Other abnormalities of gait and mobility (R26.89);Pain;Muscle weakness (generalized) (M62.81) Pain - Right/Left: Right Pain - part of body: Ankle and joints of foot     Time: 2637-8588 PT Time Calculation (min) (ACUTE ONLY): 22 min  Charges:  $Gait Training: 8-22 mins                     Reinaldo Berber, PT, DPT Acute Rehabilitation Services Pager: [phone] Office: [phone]     Reinaldo Berber 09/24/2018, 3:30 PM

## 2018-09-24 NOTE — Plan of Care (Signed)
  Problem: Activity: Goal: Risk for activity intolerance will decrease Outcome: Progressing   

## 2018-09-24 NOTE — Progress Notes (Signed)
ANTICOAGULATION CONSULT NOTE - Follow-Up Consult  Pharmacy Consult for Warfarin Indication: atrial fibrillation  Not on File  Patient Measurements: Height: 5\' 7"  (170.2 cm) Weight: 218 lb 7.6 oz (99.1 kg) IBW/kg (Calculated) : 66.1  Vital Signs: Temp: 97.7 F (36.5 C) (08/06 0502) Temp Source: Oral (08/06 0502) BP: 129/80 (08/06 0502) Pulse Rate: 89 (08/06 0827)  Labs: Recent Labs    09/22/18 0635 09/23/18 0513 09/24/18 0454  HGB 11.8* 12.2* 13.0  HCT 35.7* 36.5* 39.4  PLT 218 271 308  LABPROT 23.5* 27.3* 24.7*  INR 2.1* 2.6* 2.3*  CREATININE 2.24* 2.34* 2.18*    Estimated Creatinine Clearance: 34.4 mL/min (A) (by C-G formula based on SCr of 2.18 mg/dL (H)).   Medical History: History reviewed. No pertinent past medical history.  Assessment: 44 YOM who presented on 8/1 with R-foot swelling/pain. The patient was on warfarin PTA for hx Afib. PTA dose was 2.5 mg daily EXCEPT for 5 mg on Mon/Fri. Warfarin held initially due to concern for a need for interventions, resumed 8/3 evening s/p aspiration by ortho. Pharmacy consulted to dose.   INR today remains therapeutic (INR 2.3 << 2.6). Hgb/Hct stable. No bleeding noted.  Goal of Therapy:  INR 2-3 Monitor platelets by anticoagulation protocol: Yes   Plan:  - Warfarin 2.5 mg x 1 dose at 1800 today - Daily PT/INR, CBC q72h - Will continue to monitor for any signs/symptoms of bleeding and will follow up with PT/INR in the a.m.   Thank you for allowing pharmacy to be a part of this patient's care.  Alycia Rossetti, PharmD, BCPS Clinical Pharmacist Clinical phone for 09/24/2018: [phone] 09/24/2018 8:50 AM   **Pharmacist phone directory can now be found on [email or web address] ([email or web address]).  Listed under [place].

## 2018-09-24 NOTE — Progress Notes (Signed)
PROGRESS NOTE    Marc Owen  CHY:[id number] DOB: December 07, 1945 DOA: 09/19/2018 PCP: Patient, No Pcp Per   Brief Narrative:  HPI per Dr. [name] on 09/19/2018 Marc Owen is a 73 y.o. male with medical history significant of type 2 diabetes, COPD, atrial fibrillation, coronary artery disease, morbid obesity who presented to the ER in [place] with complaint of right ankle pain swelling and tenderness.  He was suspected to have cellulitis and possibly septic arthritis of the right ankle joint.  He was hemodynamically is stable except for significant tenderness swelling of lab changes showing white count of 11.1 elevated ESR as well as x-ray showing nose evidence of osteomyelitis.  Due to lack of orthopedic surgeon in the hospital patient was transferred here so orthopedics can evaluate patient.  Dr. [name] from [place] has accepted to see the patient.  Patient denied any fever or chills at the moment.  He has been unable to put weight on his feet.  He has been taking his medications for blood sugar as well as hypertension.  He has been accepted and will be admitted for medical service..   Assessment & Plan:   Principal Problem:   Cellulitis of right ankle Active Problems:   DM (diabetes mellitus) (HCC)   Benign essential HTN   CAD (coronary artery disease)   COPD (chronic obstructive pulmonary disease) (HCC)   CKD (chronic kidney disease), stage III (HCC)   Chronic a-fib   Pain and swelling of ankle, right   Pain and swelling of lower extremity, right  Right foot pain with concern for underlying infection with concern for cellulitis, essentially ruled out Currently afebrile, no leukocytosis MRI of the ankle and foot showed "Large ankle joint effusion and mild degenerative changes. No tibiotalar erosions or destructive bony changes or marrow edema to suggest septic arthritis or inflammatory arthropathy. No stress fractures or AVN.  Diffuse and fairly marked subcutaneous  soft tissue swelling/edema/fluid of uncertain etiology. It could reflect cellulitis which would make the ankle joint effusions somewhat more worrisome. No discrete drainable soft tissue abscess. Mild diffuse myositis." Currently off of antibiotics, as infection is highly unlikely  Joint aspirate done with Gram stain as well as cell count unremarkable, cultures are growing nothing. Doppler negative for DVT PT OT to evaluate and treat and recommending skilled nursing facility  Chronic atrial fibrillation Rate controlled Continue diltiazem, metoprolol Coumadin for anticoagulation  Diabetes Mellitus Type II Patient is hemoglobin A1c was 6.8 on admission SSI, Accu-Cheks, hypoglycemic protocol Currently holding his glipizide 2 mg p.o. daily Resumed home gabapentin 300 mg p.o. twice daily for diabetic neuropathy  Hypertension Stable Resumed home amlodipine 5 mg p.o. daily along with diltiazem 120 mg p.o. daily and metoprolol 100 mg p.o. twice daily  Coronary Artery Disease Chest pain-free Continue aspirin 81 mg p.o. daily, metoprolol 100 mg p.o. twice daily, and pravastatin 40 g p.o. nightly  AKI on Chronic kidney disease stage III Ongoing BUN/creatinine was 25/2.20 on admission Status post IV fluids Due to leg swelling, Lasix was restarted We will monitor very closely Daily BMP  COPD Assess for home O2 eval Continue nebulizations Plan to DC on some inhalers  Anemia likely of Chronic Kidney Disease Stable Anemia panel showed an iron level of 28, U IBC of 300, TIBC of 328, saturation ratio of 9%, ferritin level 242, folate level 8.1, vitamin B12 level 184 Daily CBC  Obesity -Estimated body mass index is 34.22 kg/m as calculated from the following:   Height as of  this encounter: _0  (1.702 m).   Weight as of this encounter: 99.1 kg. -Weight Loss and Dietary Counseling given   Hypothyroidism Continue levothyroxine 175 mcg p.o. daily  GERD Continue with pantoprazole  40 mg p.o. daily  Depression and Anxiety Continue venlafaxine XR 150 g p.o. daily with breakfast  Gout Continue allopurinol 200 mg p.o. twice daily   DVT prophylaxis: Heparin 5000 units subcu every 8h Code Status: FULL CODE Family Communication: No Family present at bedside  Disposition Plan: Likely SNF placement if patient is agreeable  Consultants:   Orthopedic Surgery Dr. [name]    Procedures: MRI of the Ankle and Foot  Right ankle aspiration and injection done by [facility] Odor of orthopedics with 5 mL's of 0.5% lidocaine instilled  Antimicrobials:  Anti-infectives (From admission, onward)   Start     Dose/Rate Route Frequency Ordered Stop   09/22/18 0230  vancomycin (VANCOCIN) 1,500 mg in sodium chloride 0.9 % 500 mL IVPB  Status:  Discontinued     1,500 mg 250 mL/hr over 120 Minutes Intravenous Every 48 hours 09/21/18 1111 09/21/18 1628   09/20/18 1800  ceFEPIme (MAXIPIME) 2 g in sodium chloride 0.9 % 100 mL IVPB  Status:  Discontinued     2 g 200 mL/hr over 30 Minutes Intravenous Every 12 hours 09/20/18 1322 09/21/18 1628   09/20/18 0800  piperacillin-tazobactam (ZOSYN) IVPB 3.375 g  Status:  Discontinued     3.375 g 12.5 mL/hr over 240 Minutes Intravenous Every 8 hours 09/20/18 0131 09/20/18 1317   09/20/18 0145  vancomycin (VANCOCIN) 1,750 mg in sodium chloride 0.9 % 500 mL IVPB  Status:  Discontinued     1,750 mg 250 mL/hr over 120 Minutes Intravenous Every 48 hours 09/20/18 0131 09/21/18 1111   09/19/18 2300  piperacillin-tazobactam (ZOSYN) IVPB 3.375 g     3.375 g 100 mL/hr over 30 Minutes Intravenous  Once 09/19/18 2226 09/20/18 0046     Subjective: Patient denies any new complaints, still with right ankle swelling and pain  Objective: Vitals:   09/23/18 2047 09/23/18 2110 09/24/18 0502 09/24/18 0827  BP:  117/69 129/80   Pulse:  75 91 89  Resp:  _1 Temp:  98.4 F (36.9 C) 97.7 F (36.5 C)   TempSrc:   Oral   SpO2: 97% 90% 98% 98%  Weight:   99.1 kg    Height:        Intake/Output Summary (Last 24 hours) at 09/24/2018 1438 Last data filed at 09/24/2018 0753 Gross per 24 hour  Intake 540 ml  Output 2600 ml  Net -2060 ml   Filed Weights   09/20/18 0041 09/21/18 2049 09/23/18 2110  Weight: 99.3 kg 99.5 kg 99.1 kg   Examination: Physical Exam:  General: NAD   Cardiovascular: S1, S2 present  Respiratory: CTAB  Abdomen: Soft, nontender, nondistended, bowel sounds present  Musculoskeletal: Significant right foot edema  Skin: Normal  Psychiatry: Normal mood    Data Reviewed: I have personally reviewed following labs and imaging studies  CBC: Recent Labs  Lab 09/20/18 0024 09/21/18 0619 09/22/18 0635 09/23/18 0513 09/24/18 0454  WBC 10.6* 9.4 9.5 9.6 9.8  NEUTROABS  --  6.9 7.0 7.1 6.8  HGB 12.7* 12.4* 11.8* 12.2* 13.0  HCT 39.0 37.9* 35.7* 36.5* 39.4  MCV 96.3 95.5 95.5 93.4 95.2  PLT 244 237 218 271 664   Basic Metabolic Panel: Recent Labs  Lab 09/20/18 0024 09/21/18 4034 09/22/18 7425 09/23/18 0513 09/24/18 0454  NA 139 137 136 134* 135  K 3.5 3.5 3.4* 3.6 4.0  CL 105 101 101 98 95*  CO2 _0 GLUCOSE 134* 89 90 128* 113*  BUN 25* 27* 34* 36* 42*  CREATININE 2.20* 2.15* 2.24* 2.34* 2.18*  CALCIUM 9.5 9.7 9.7 9.6 10.6*  MG  --  1.6* 2.0 1.9  --   PHOS  --  3.0 2.7 2.8  --    GFR: Estimated Creatinine Clearance: 34.4 mL/min (A) (by C-G formula based on SCr of 2.18 mg/dL (H)). Liver Function Tests: Recent Labs  Lab 09/20/18 0024 09/21/18 0619 09/22/18 0635 09/23/18 0513  AST 13* 11* 13* 16  ALT _1 ALKPHOS 97 81 91 100  BILITOT 0.8 1.2 1.2 0.9  PROT 7.4 7.1 7.0 7.1  ALBUMIN 3.6 3.0* 2.8* 2.8*   No results for input(s): LIPASE, AMYLASE in the last 168 hours. No results for input(s): AMMONIA in the last 168 hours. Coagulation Profile: Recent Labs  Lab 09/21/18 0619 09/22/18 0635 09/23/18 0513 09/24/18 0454  INR 2.6* 2.1* 2.6* 2.3*   Cardiac Enzymes: No  results for input(s): CKTOTAL, CKMB, CKMBINDEX, TROPONINI in the last 168 hours. BNP (last 3 results) No results for input(s): PROBNP in the last 8760 hours. HbA1C: No results for input(s): HGBA1C in the last 72 hours. CBG: Recent Labs  Lab 09/23/18 1114 09/23/18 1636 09/23/18 2110 09/24/18 0706 09/24/18 1129  GLUCAP 123* 127* 138* 98 108*   Lipid Profile: No results for input(s): CHOL, HDL, LDLCALC, TRIG, CHOLHDL, LDLDIRECT in the last 72 hours. Thyroid Function Tests: Recent Labs    09/22/18 0635  TSH 1.363   Anemia Panel: Recent Labs    09/22/18 0635  VITAMINB12 184  FOLATE 8.1  FERRITIN 242  TIBC 328  IRON 28*  RETICCTPCT 1.7   Sepsis Labs: No results for input(s): PROCALCITON, LATICACIDVEN in the last 168 hours.  Recent Results (from the past 240 hour(s))  SARS CORONAVIRUS 2 Nasal Swab Aptima Multi Swab     Status: None   Collection Time: 09/19/18 11:28 PM   Specimen: Aptima Multi Swab; Nasal Swab  Result Value Ref Range Status   SARS Coronavirus 2 NEGATIVE NEGATIVE Final    Comment: (NOTE) SARS-CoV-2 target nucleic acids are NOT DETECTED. The SARS-CoV-2 RNA is generally detectable in upper and lower respiratory specimens during the acute phase of infection. Negative results do not preclude SARS-CoV-2 infection, do not rule out co-infections with other pathogens, and should not be used as the sole basis for treatment or other patient management decisions. Negative results must be combined with clinical observations, patient history, and epidemiological information. The expected result is Negative. Fact Sheet for Patients: SugarRoll.be Fact Sheet for Healthcare Providers: https://www.woods-mathews.com/ This test is not yet approved or cleared by the [place] FDA and  has been authorized for detection and/or diagnosis of SARS-CoV-2 by FDA under an Emergency Use Authorization (EUA). This EUA will remain  in  effect (meaning this test can be used) for the duration of the COVID-19 declaration under Section 56 4(b)(1) of the Act, 21 U.S.C. section 360bbb-3(b)(1), unless the authorization is terminated or revoked sooner. Performed at [facility], [place] [address] [place], [place] [address]   Body fluid culture     Status: None   Collection Time: 09/21/18 11:26 AM   Specimen: Ankle; Body Fluid  Result Value Ref Range Status   Specimen Description ANKLE  Final   Special Requests NONE  Final   Gram Stain   Final    FEW WBC PRESENT, PREDOMINANTLY PMN NO ORGANISMS SEEN    Culture   Final    NO GROWTH 3 DAYS Performed at [facility] [address], [place], [place] [address]    Report Status 09/24/2018 FINAL  Final    Radiology Studies: Dg Chest Port 1 View  Result Date: 09/23/2018 CLINICAL DATA:  Shortness of breath.  07/08/2017. EXAM: PORTABLE CHEST 1 VIEW FINDINGS: Mediastinum and hilar structures normal. Stable cardiomegaly. Low lung. Mild left base subsegmental axis and or scarring. Stable prior exam. Filtrate noted. No pleural effusion or pneumothorax. IMPRESSION: 1. Cardiomegaly. No pulmonary venous congestion. 2. Low lung volumes. Mild left base subsegmental atelectasis/scarring, no change from prior exam. No acute pulmonary infiltrate noted. Electronically Signed   By: Marcello Moores  Register   On: 09/23/2018 08:47   Scheduled Meds: . allopurinol  200 mg Oral BID  . amLODipine  5 mg Oral Daily  . aspirin EC  81 mg Oral Daily  . bupivacaine  10 mL Infiltration Once  . cinacalcet  30 mg Oral BID WC  . diltiazem  120 mg Oral Daily  . furosemide  40 mg Oral BID  . gabapentin  300 mg Oral BID  . guaiFENesin  1,200 mg Oral BID  . insulin aspart  0-5 Units Subcutaneous QHS  . insulin aspart  0-9 Units Subcutaneous TID WC  . ipratropium  0.5 mg Nebulization TID  . levalbuterol  0.63 mg Nebulization TID  . levothyroxine  175 mcg Oral Q0600  . metoprolol tartrate  100 mg Oral BID   . pantoprazole  40 mg Oral Daily  . pravastatin  40 mg Oral QHS  . venlafaxine XR  150 mg Oral Q breakfast  . warfarin  2.5 mg Oral ONCE-1800  . Warfarin - Pharmacist Dosing Inpatient   Does not apply q1800   Continuous Infusions:   LOS: 5 days   Alma Friendly, MD [facility] PAGER is on [place]  If 7PM-7AM, please contact night-coverage [email or web address] Password [facility] 09/24/2018, 2:38 PM

## 2018-09-25 LAB — BASIC METABOLIC PANEL
Anion gap: 14 (ref 5–15)
BUN: 43 mg/dL — ABNORMAL HIGH (ref 8–23)
CO2: 27 mmol/L (ref 22–32)
Calcium: 11 mg/dL — ABNORMAL HIGH (ref 8.9–10.3)
Chloride: 94 mmol/L — ABNORMAL LOW (ref 98–111)
Creatinine, Ser: 2.37 mg/dL — ABNORMAL HIGH (ref 0.61–1.24)
GFR calc Af Amer: 31 mL/min — ABNORMAL LOW (ref >60)
GFR calc non Af Amer: 26 mL/min — ABNORMAL LOW (ref >60)
Glucose, Bld: 141 mg/dL — ABNORMAL HIGH (ref 70–99)
Potassium: 3.4 mmol/L — ABNORMAL LOW (ref 3.5–5.1)
Sodium: 135 mmol/L (ref 135–145)

## 2018-09-25 LAB — PROTIME-INR
INR: 1.9 — ABNORMAL HIGH (ref 0.8–1.2)
Prothrombin Time: 21.6 seconds — ABNORMAL HIGH (ref 11.4–15.2)

## 2018-09-25 LAB — GLUCOSE, CAPILLARY
Glucose-Capillary: 136 mg/dL — ABNORMAL HIGH (ref 70–99)
Glucose-Capillary: 142 mg/dL — ABNORMAL HIGH (ref 70–99)
Glucose-Capillary: 116 mg/dL — ABNORMAL HIGH (ref 70–99)

## 2018-09-25 MED ORDER — BUDESONIDE-FORMOTEROL FUMARATE 80-4.5 MCG/ACT IN AERO: 2.0000 | INHALATION_SPRAY | Freq: Two times a day (BID) | RESPIRATORY_TRACT | 0 refills | Status: AC

## 2018-09-25 MED ORDER — WARFARIN SODIUM 5 MG PO TABS
5.0000 mg | ORAL_TABLET | Freq: Once | ORAL | Status: AC
  Administered 2018-09-25: 5 mg via ORAL
  Filled 2018-09-25: qty 1

## 2018-09-25 NOTE — TOC Transition Note (Signed)
Transition of Care [facility]) - CM/SW Discharge Note   Patient Details  Name: Marc Owen MRN: [id number] Date of Birth: 06-29-1945  [facility] CM/SW Contact:  Pollie Friar, RN Phone Number: 09/25/2018, 5:54 PM   Clinical Narrative:    Pt discharging home with [facility] services through [facility]. CM faxed them the orders per request.  Pt's PCP is [facility].  Pt has transportation home.   Final next level of care: Home w Home Health Services Barriers to Discharge: No Barriers Identified   Patient Goals and CMS Choice   CMS Medicare.gov Compare Post Acute Care list provided to:: Patient Choice offered to / list presented to : Patient  Discharge Placement                       Discharge Plan and Services                          HH Arranged: PT [facility] Agency: [place] Date [place]: 09/25/18      Social Determinants of Health (SDOH) Interventions     Readmission Risk Interventions No flowsheet data found.

## 2018-09-25 NOTE — Progress Notes (Signed)
Physical Therapy Treatment Patient Details Name: Marc Owen MRN: [id number] DOB: 11/04/1945 Today's Date: 09/25/2018    History of Present Illness Marc Owen is a 73 y.o. male with medical history significant of type 2 diabetes, COPD, atrial fibrillation, coronary artery disease, morbid obesity who presented to the ER in [place] with complaint of right ankle pain swelling and tenderness.  He was suspected to have cellulitis and possibly septic arthritis of the right ankle joint. Aspiration done; Per Ortho, will maintain NWB RLE    PT Comments    Patient progressing well with PT, ambulating unit on RA with RW, SpO2 96%. Cont to rec HHPT  At this time, may progress to OP PT.    Follow Up Recommendations  Home health PT     Equipment Recommendations  Rolling walker with 5" wheels;Standard walker(Tub Bench)    Recommendations for Other Services       Precautions / Restrictions Precautions Precautions: Fall Restrictions Weight Bearing Restrictions: Yes Other Position/Activity Restrictions: WBAT    Mobility  Bed Mobility               General bed mobility comments: sitting EOB upon OT arrival  Transfers Overall transfer level: Needs assistance Equipment used: Rolling walker (2 wheeled) Transfers: Sit to/from Stand Sit to Stand: Supervision         General transfer comment: S for transfers  Ambulation/Gait Ambulation/Gait assistance: Supervision Gait Distance (Feet): 200 Feet Assistive device: Rolling walker (2 wheeled) Gait Pattern/deviations: Step-to pattern Gait velocity: decreased   General Gait Details: cues for NWB, patient unable to do without PWB, light pressure use of BUE on RW, no balance or stability concerns VSS on RA    Stairs             Wheelchair Mobility    Modified Rankin (Stroke Patients Only)       Balance Overall balance assessment: Needs assistance Sitting-balance support: No upper extremity supported;Feet  unsupported Sitting balance-Leahy Scale: Good     Standing balance support: Bilateral upper extremity supported Standing balance-Leahy Scale: Poor Standing balance comment: reliant on bilateral UE support                            Cognition Arousal/Alertness: Awake/alert Behavior During Therapy: WFL for tasks assessed/performed Overall Cognitive Status: Within Functional Limits for tasks assessed                                        Exercises      General Comments        Pertinent Vitals/Pain Pain Location: L lowerback;RLE Pain Descriptors / Indicators: Sore;Grimacing;Guarding    Home Living                      Prior Function            PT Goals (current goals can now be found in the care plan section) Acute Rehab PT Goals Patient Stated Goal: to get stronger PT Goal Formulation: With patient Time For Goal Achievement: 10/06/18 Potential to Achieve Goals: Good    Frequency    Min 3X/week      PT Plan Current plan remains appropriate    Co-evaluation              AM-PAC PT "6 Clicks" Mobility   Outcome Measure  Help needed turning from  your back to your side while in a flat bed without using bedrails?: None Help needed moving from lying on your back to sitting on the side of a flat bed without using bedrails?: None Help needed moving to and from a bed to a chair (including a wheelchair)?: None Help needed standing up from a chair using your arms (e.g., wheelchair or bedside chair)?: A Little Help needed to walk in hospital room?: A Little Help needed climbing 3-5 steps with a railing? : A Little 6 Click Score: 21    End of Session Equipment Utilized During Treatment: Gait belt Activity Tolerance: Patient tolerated treatment well Patient left: in bed;with call bell/phone within reach;with bed alarm set Nurse Communication: Mobility status PT Visit Diagnosis: Unsteadiness on feet (R26.81);Other  abnormalities of gait and mobility (R26.89);Pain;Muscle weakness (generalized) (M62.81) Pain - Right/Left: Right Pain - part of body: Ankle and joints of foot     Time: 1400-1420 PT Time Calculation (min) (ACUTE ONLY): 20 min  Charges:  $Gait Training: 8-22 mins                     Etta GrandchildSean Yacoub Diltz, PT, DPT Acute Rehabilitation Services Pager: [phone] Office: [phone]    Etta GrandchildSean Koray Soter 09/25/2018, 2:53 PM

## 2018-09-25 NOTE — Plan of Care (Signed)
  Problem: Health Behavior/Discharge Planning: Goal: Ability to manage health-related needs will improve Outcome: Progressing   Problem: Clinical Measurements: Goal: Ability to maintain clinical measurements within normal limits will improve Outcome: Progressing Goal: Will remain free from infection Outcome: Progressing Goal: Respiratory complications will improve Outcome: Progressing   

## 2018-09-25 NOTE — Plan of Care (Signed)
  Problem: Education: Goal: Knowledge of General Education information will improve Description: Including pain rating scale, medication(s)/side effects and non-pharmacologic comfort measures Outcome: Adequate for Discharge   Problem: Health Behavior/Discharge Planning: Goal: Ability to manage health-related needs will improve 09/25/2018 1832 by Baldo Ash, RN Outcome: Adequate for Discharge 09/25/2018 0900 by Baldo Ash, RN Outcome: Progressing   Problem: Clinical Measurements: Goal: Ability to maintain clinical measurements within normal limits will improve 09/25/2018 1832 by Baldo Ash, RN Outcome: Adequate for Discharge 09/25/2018 0900 by Baldo Ash, RN Outcome: Progressing Goal: Will remain free from infection Outcome: Adequate for Discharge Goal: Diagnostic test results will improve Outcome: Adequate for Discharge Goal: Respiratory complications will improve Outcome: Adequate for Discharge Goal: Cardiovascular complication will be avoided Outcome: Adequate for Discharge   Problem: Clinical Measurements: Goal: Will remain free from infection Outcome: Adequate for Discharge   Problem: Clinical Measurements: Goal: Diagnostic test results will improve Outcome: Adequate for Discharge   Problem: Activity: Goal: Risk for activity intolerance will decrease Outcome: Adequate for Discharge   Problem: Nutrition: Goal: Adequate nutrition will be maintained Outcome: Adequate for Discharge   Problem: Coping: Goal: Level of anxiety will decrease Outcome: Adequate for Discharge   Problem: Elimination: Goal: Will not experience complications related to bowel motility Outcome: Adequate for Discharge Goal: Will not experience complications related to urinary retention Outcome: Adequate for Discharge   Problem: Pain Managment: Goal: General experience of comfort will improve Outcome: Adequate for Discharge   Problem: Skin Integrity: Goal: Risk for impaired  skin integrity will decrease Outcome: Adequate for Discharge   Problem: Acute Rehab OT Goals (only OT should resolve) Goal: Pt. Will Perform Grooming Outcome: Adequate for Discharge Goal: Pt. Will Perform Upper Body Dressing Outcome: Adequate for Discharge Goal: Pt. Will Perform Lower Body Dressing Outcome: Adequate for Discharge Goal: Pt. Will Transfer To Toilet Outcome: Adequate for Discharge Goal: Pt. Will Perform Tub/Shower Transfer Outcome: Adequate for Discharge   Problem: Acute Rehab PT Goals(only PT should resolve) Goal: Pt Will Go Supine/Side To Sit Outcome: Adequate for Discharge Goal: Pt Will Go Sit To Supine/Side Outcome: Adequate for Discharge Goal: Patient Will Transfer Sit To/From Stand Outcome: Adequate for Discharge Goal: Pt Will Transfer Bed To Chair/Chair To Bed Outcome: Adequate for Discharge Goal: Pt Will Ambulate Outcome: Adequate for Discharge

## 2018-09-25 NOTE — Discharge Summary (Signed)
Discharge Summary  Marc Owen GYF:[id number] DOB: 1945/09/24  PCP: Patient, No Pcp Per  Admit date: 09/19/2018 Discharge date: 09/25/2018  Time spent: 35 mins   Recommendations for Outpatient Follow-up:  1. Follow-up with PCP in 1 week 2. Follow-up with orthopedics Dr [name]   Discharge Diagnoses:  Active Hospital Problems   Diagnosis Date Noted   Cellulitis of right ankle 09/19/2018   Pain and swelling of ankle, right 09/20/2018   Pain and swelling of lower extremity, right 09/20/2018   DM (diabetes mellitus) ([facility]) 09/19/2018   Benign essential HTN 09/19/2018   CAD (coronary artery disease) 09/19/2018   COPD (chronic obstructive pulmonary disease) ([facility]) 09/19/2018   CKD (chronic kidney disease), stage III ([facility]) 09/19/2018   Chronic a-fib 09/19/2018    Resolved Hospital Problems  No resolved problems to display.    Discharge Condition: Stable  Diet recommendation: Heart healthy  Vitals:   09/25/18 0919 09/25/18 1435  BP: 124/77   Pulse: 78   Resp: 18   Temp: 97.7 F (36.5 C)   SpO2: 91% 94%    History of present illness:  Marc Owen a 73 y.o.malewith medical history significant oftype 2 diabetes, COPD, atrial fibrillation, coronary artery disease, morbid obesity who presented to the ER in [place] with complaint of right ankle pain swelling and tenderness. He was suspected to have cellulitis and possibly septic arthritis of the right ankle joint. He was hemodynamically is stable except for significant tenderness swelling of lab changes showing white count of 11.1 elevated ESR as well as x-ray showing nose evidence of osteomyelitis. Due to lack of orthopedic surgeon in the hospital patient was transferred here so orthopedics can evaluate patient. Dr. [name] from [facility] has accepted to see the patient. Patient denied any fever or chills at the moment. He has been unable to put weight on his feet. He has been taking his medications for  blood sugar as well as hypertension. He has been accepted and will be admitted for medical service.   Today, patient denies any new complaints, reports right ankle has somewhat improved.  Denies any new complaints, denies any chest pain, shortness of breath, fever/chills, abdominal pain, nausea/vomiting/diarrhea.  Home O2 eval was done, patient was saturating 96% on room air during ambulation and at rest.  Patient to follow-up with orthopedics and primary care.  Hospital Course:  Principal Problem:   Cellulitis of right ankle Active Problems:   DM (diabetes mellitus) ([facility])   Benign essential HTN   CAD (coronary artery disease)   COPD (chronic obstructive pulmonary disease) ([facility])   CKD (chronic kidney disease), stage III ([facility])   Chronic a-fib   Pain and swelling of ankle, right   Pain and swelling of lower extremity, right   Right foot pain with concern for underlying infection with concern for cellulitis, essentially ruled out Currently afebrile, no leukocytosis MRI of the ankle and foot showed "Large ankle joint effusion and mild degenerative changes. No tibiotalar erosions or destructive bony changes or marrow edema to suggest septic arthritis or inflammatory arthropathy. No stress fractures or AVN.  Diffuse and fairly marked subcutaneous soft tissue swelling/edema/fluid of uncertain etiology. It could reflect cellulitis which would make the ankle joint effusions somewhat more worrisome. No discrete drainable soft tissue abscess. Mild diffuse myositis." Currently off of antibiotics, as infection is highly unlikely  Joint aspirate done with Gram stain as well as cell count unremarkable, cultures are growing nothing. Doppler negative for DVT Home health PT ordered, patient to follow-up with  orthopedics in a couple of weeks. Follow-up with PCP as well  Chronic atrial fibrillation Rate controlled Continue diltiazem, metoprolol Coumadin for anticoagulation  Diabetes Mellitus Type  II Patient is hemoglobin A1c was 6.8 on admission SSI, Accu-Cheks, hypoglycemic protocol Continue home glipizide 2 mg p.o. daily, gabapentin 300 mg p.o. twice daily for diabetic neuropathy  Hypertension Stable Resumed home amlodipine 5 mg p.o. daily along with diltiazem 120 mg p.o. daily and metoprolol 100 mg p.o. twice daily  Coronary Artery Disease Chest pain-free Continue aspirin 81 mg p.o. daily, metoprolol 100 mg p.o. twice daily, and pravastatin 40 g p.o. nightly  Chronic kidney disease stage III Baseline creatinine around 2 BUN/creatinine was 25/2.20 on admission  COPD Patient saturating around 96% on room air, during ambulation and at rest Start Symbicort inhaler on discharge Follow-up with PCP with official pulmonary function test and further recommendation  Anemia likely of Chronic Kidney Disease Stable Anemia panel showed an iron level of 28, U IBC of 300, TIBC of 328, saturation ratio of 9%, ferritin level 242, folate level 8.1, vitamin B12 level 184 Daily CBC  Obesity Estimated body mass index is 34.22 kg/m as calculated from the following:   Height as of this encounter: _0  (1.702 m).   Weight as of this encounter: 99.1 kg. Weight Loss and Dietary Counseling given  May benefit from sleep study to rule out OSA  Hypothyroidism Continue levothyroxine 175 mcg p.o. daily  GERD Continue with pantoprazole 40 mg p.o. daily  Depression and Anxiety Continue venlafaxine XR 150 g p.o. daily with breakfast  Gout Continue allopurinol 200 mg p.o. twice daily         Malnutrition Type:      Malnutrition Characteristics:      Nutrition Interventions:      Estimated body mass index is 34.22 kg/m as calculated from the following:   Height as of this encounter: _1  (1.702 m).   Weight as of this encounter: 99.1 kg.    Procedures:  Right ankle aspiration and injection done by orthopedics on  09/21/2018  Consultations:  Orthopedics  Discharge Exam: BP 124/77 (BP Location: Right Arm)    Pulse 78    Temp 97.7 F (36.5 C) (Oral)    Resp 18    Ht _2  (1.702 m)    Wt 99.1 kg    SpO2 94%    BMI 34.22 kg/m   General: NAD Cardiovascular: S1, S2 present Respiratory: CTA B  Discharge Instructions You were cared for by a hospitalist during your hospital stay. If you have any questions about your discharge medications or the care you received while you were in the hospital after you are discharged, you can call the unit and asked to speak with the hospitalist on call if the hospitalist that took care of you is not available. Once you are discharged, your primary care physician will handle any further medical issues. Please note that NO REFILLS for any discharge medications will be authorized once you are discharged, as it is imperative that you return to your primary care physician (or establish a relationship with a primary care physician if you do not have one) for your aftercare needs so that they can reassess your need for medications and monitor your lab values.   Allergies as of 09/25/2018   Not on File     Medication List    TAKE these medications   allopurinol 100 MG tablet Commonly known as: ZYLOPRIM Take 200 mg by mouth 2 (  two) times daily.   amLODipine 10 MG tablet Commonly known as: NORVASC Take 5 mg by mouth daily.   aspirin EC 81 MG tablet Take 81 mg by mouth daily.   budesonide-formoterol 80-4.5 MCG/ACT inhaler Commonly known as: Symbicort Inhale 2 puffs into the lungs 2 (two) times daily.   capsaicin 0.025 % cream Commonly known as: ZOSTRIX Apply 1 application topically 2 (two) times daily as needed (pain).   cinacalcet 30 MG tablet Commonly known as: SENSIPAR Take 30 mg by mouth 2 (two) times daily with a meal.   diltiazem 120 MG 24 hr capsule Commonly known as: TIAZAC Take 120 mg by mouth daily.   furosemide 40 MG tablet Commonly known as:  LASIX Take 40 mg by mouth daily.   gabapentin 300 MG capsule Commonly known as: NEURONTIN Take 300 mg by mouth 2 (two) times daily.   glipiZIDE 5 MG tablet Commonly known as: GLUCOTROL Take by mouth 2 (two) times daily before a meal.   levothyroxine 175 MCG tablet Commonly known as: SYNTHROID Take 175 mcg by mouth daily before breakfast.   metoprolol tartrate 100 MG tablet Commonly known as: LOPRESSOR Take 100 mg by mouth 2 (two) times daily.   morphine 15 MG tablet Commonly known as: MSIR Take 15 mg by mouth every 12 (twelve) hours as needed for severe pain.   pantoprazole 40 MG tablet Commonly known as: PROTONIX Take 40 mg by mouth daily.   pravastatin 80 MG tablet Commonly known as: PRAVACHOL Take 40 mg by mouth at bedtime.   traZODone 50 MG tablet Commonly known as: DESYREL Take 50 mg by mouth at bedtime as needed for sleep.   venlafaxine XR 150 MG 24 hr capsule Commonly known as: EFFEXOR-XR Take 150 mg by mouth daily with breakfast.   warfarin 5 MG tablet Commonly known as: COUMADIN Take 5 mg by mouth as directed. 1/2 tablet (2.5 mg ) all days except 1 tablet M + F      Not on File Follow-up Information    Erle Crocker, MD Follow up.   Specialty: Orthopedic Surgery Contact information: [address] [place] [address] [phone]            The results of significant diagnostics from this hospitalization (including imaging, microbiology, ancillary and laboratory) are listed below for reference.    Significant Diagnostic Studies: Dg Ankle Complete Right  Result Date: 09/20/2018 CLINICAL DATA:  Cellulitis, pain, swelling EXAM: RIGHT ANKLE - COMPLETE 3+ VIEW COMPARISON:  None. FINDINGS: No acute bony abnormality. Specifically, no fracture, subluxation, or dislocation. Mild diffuse soft tissue swelling. No bone destruction to suggest osteomyelitis. IMPRESSION: No acute bony abnormality. Electronically Signed   By: Rolm Baptise M.D.   On:  09/20/2018 01:18   Mr Foot Right Wo Contrast  Result Date: 09/20/2018 CLINICAL DATA:  Diffuse foot and ankle pain.  Abnormal foot films. EXAM: MRI OF THE RIGHT ANKLE WITHOUT CONTRAST; MRI OF THE RIGHT FOREFOOT WITHOUT CONTRAST TECHNIQUE: Multiplanar, multisequence MR imaging of the ankle was performed. No intravenous contrast was administered. COMPARISON:  Radiographs 09/19/2018 and 09/20/2018. FINDINGS: Examination is quite limited due to patient motion. TENDONS Peroneal: Intact Posteromedial: Intact Anterior: Intact Achilles: Intact Plantar Fascia: Intact LIGAMENTS Lateral: Intact Medial: Intact CARTILAGE Ankle Joint: There is a large ankle joint effusion. Mild to moderate degenerative chondrosis with joint space narrowing but no full-thickness cartilage defect or osteochondral lesion. Cystic lucency noted in the distal fibula appears fairly well corticated and there is no surrounding edema  like inflammatory changes. This could be a large subchondral cyst or intraosseous ganglion. Do not see any definite erosions involving the tibia or talus. Subtalar Joints/Sinus Tarsi: Mild subtalar joint degenerative changes and small joint effusions. Mild inflammation/edema in the sinus tarsi but the cervical and interosseous ligaments appear intact. Bones: As demonstrated on the radiographs there is cystic change in the third metatarsal base. This appears to be a multi septated cystic lesion, possibly a benign bone cyst or intraosseous ganglion. I do not see any surrounding edema or inflammation to suggest a pathologic fracture or aggressive lesion. No metatarsal stress fractures. Other: Diffuse and fairly significant subcutaneous soft tissue swelling/edema/fluid which is nonspecific but certainly could reflect cellulitis. There is also nonspecific edema in the foot musculature suggesting myositis. No discrete drainable soft tissue abscess is identified. IMPRESSION: 1. Large ankle joint effusion and mild degenerative  changes. No tibiotalar erosions or destructive bony changes or marrow edema to suggest septic arthritis or inflammatory arthropathy. 2. Cystic lesion in the base of the third metatarsal has nonaggressive MR and plain film features is likely a benign bone cyst or intraosseous ganglion. 3. No stress fractures or AVN. 4. Diffuse and fairly marked subcutaneous soft tissue swelling/edema/fluid of uncertain etiology. It could reflect cellulitis which would make the ankle joint effusions somewhat more worrisome. No discrete drainable soft tissue abscess. Mild diffuse myositis. Electronically Signed   By: Marijo Sanes M.D.   On: 09/20/2018 11:16   Mr Ankle Right Wo Contrast  Result Date: 09/20/2018 CLINICAL DATA:  Diffuse foot and ankle pain.  Abnormal foot films. EXAM: MRI OF THE RIGHT ANKLE WITHOUT CONTRAST; MRI OF THE RIGHT FOREFOOT WITHOUT CONTRAST TECHNIQUE: Multiplanar, multisequence MR imaging of the ankle was performed. No intravenous contrast was administered. COMPARISON:  Radiographs 09/19/2018 and 09/20/2018. FINDINGS: Examination is quite limited due to patient motion. TENDONS Peroneal: Intact Posteromedial: Intact Anterior: Intact Achilles: Intact Plantar Fascia: Intact LIGAMENTS Lateral: Intact Medial: Intact CARTILAGE Ankle Joint: There is a large ankle joint effusion. Mild to moderate degenerative chondrosis with joint space narrowing but no full-thickness cartilage defect or osteochondral lesion. Cystic lucency noted in the distal fibula appears fairly well corticated and there is no surrounding edema like inflammatory changes. This could be a large subchondral cyst or intraosseous ganglion. Do not see any definite erosions involving the tibia or talus. Subtalar Joints/Sinus Tarsi: Mild subtalar joint degenerative changes and small joint effusions. Mild inflammation/edema in the sinus tarsi but the cervical and interosseous ligaments appear intact. Bones: As demonstrated on the radiographs there is  cystic change in the third metatarsal base. This appears to be a multi septated cystic lesion, possibly a benign bone cyst or intraosseous ganglion. I do not see any surrounding edema or inflammation to suggest a pathologic fracture or aggressive lesion. No metatarsal stress fractures. Other: Diffuse and fairly significant subcutaneous soft tissue swelling/edema/fluid which is nonspecific but certainly could reflect cellulitis. There is also nonspecific edema in the foot musculature suggesting myositis. No discrete drainable soft tissue abscess is identified. IMPRESSION: 1. Large ankle joint effusion and mild degenerative changes. No tibiotalar erosions or destructive bony changes or marrow edema to suggest septic arthritis or inflammatory arthropathy. 2. Cystic lesion in the base of the third metatarsal has nonaggressive MR and plain film features is likely a benign bone cyst or intraosseous ganglion. 3. No stress fractures or AVN. 4. Diffuse and fairly marked subcutaneous soft tissue swelling/edema/fluid of uncertain etiology. It could reflect cellulitis which would make the ankle joint effusions somewhat  more worrisome. No discrete drainable soft tissue abscess. Mild diffuse myositis. Electronically Signed   By: Marijo Sanes M.D.   On: 09/20/2018 11:16   Dg Chest Port 1 View  Result Date: 09/23/2018 CLINICAL DATA:  Shortness of breath.  07/08/2017. EXAM: PORTABLE CHEST 1 VIEW FINDINGS: Mediastinum and hilar structures normal. Stable cardiomegaly. Low lung. Mild left base subsegmental axis and or scarring. Stable prior exam. Filtrate noted. No pleural effusion or pneumothorax. IMPRESSION: 1. Cardiomegaly. No pulmonary venous congestion. 2. Low lung volumes. Mild left base subsegmental atelectasis/scarring, no change from prior exam. No acute pulmonary infiltrate noted. Electronically Signed   By: Marcello Moores  Register   On: 09/23/2018 08:47   Dg Foot Complete Right  Result Date: 09/20/2018 CLINICAL DATA:   Cellulitis, pain, swelling EXAM: RIGHT FOOT COMPLETE - 3+ VIEW COMPARISON:  None. FINDINGS: No acute bony abnormality. Specifically, no fracture, subluxation, or dislocation. Soft tissue swelling along the dorsum of the foot. No bone destruction to suggest osteomyelitis IMPRESSION: No acute bony abnormality. Electronically Signed   By: Rolm Baptise M.D.   On: 09/20/2018 01:17   Vas [place] Lower Extremity Venous (dvt)  Result Date: 09/21/2018  Lower Venous Study Indications: Right Foot Pain, and Swelling. Other Indications: Patient is on Warfarin with an INR of 3.1. Comparison Study: No prior study on file for comparison. Performing Technologist: Sharion Dove RVS  Examination Guidelines: A complete evaluation includes B-mode imaging, spectral Doppler, color Doppler, and power Doppler as needed of all accessible portions of each vessel. Bilateral testing is considered an integral part of a complete examination. Limited examinations for reoccurring indications may be performed as noted.  +---------+---------------+---------+-----------+----------+-------+  RIGHT     Compressibility Phasicity Spontaneity Properties Summary  +---------+---------------+---------+-----------+----------+-------+  CFV       Full            Yes       Yes                             +---------+---------------+---------+-----------+----------+-------+  SFJ       Full                                                      +---------+---------------+---------+-----------+----------+-------+  FV Prox   Full                                                      +---------+---------------+---------+-----------+----------+-------+  FV Mid    Full                                                      +---------+---------------+---------+-----------+----------+-------+  FV Distal Full                                                      +---------+---------------+---------+-----------+----------+-------+  PFV  Full                                                       +---------+---------------+---------+-----------+----------+-------+  POP       Full            Yes       Yes                             +---------+---------------+---------+-----------+----------+-------+  PTV       Full                                                      +---------+---------------+---------+-----------+----------+-------+  PERO      Full                                                      +---------+---------------+---------+-----------+----------+-------+  GSV       Full                                                      +---------+---------------+---------+-----------+----------+-------+  SSV       Full                                                      +---------+---------------+---------+-----------+----------+-------+   +---------+---------------+---------+-----------+----------+-------+  LEFT      Compressibility Phasicity Spontaneity Properties Summary  +---------+---------------+---------+-----------+----------+-------+  CFV       Full            Yes       Yes                             +---------+---------------+---------+-----------+----------+-------+  SFJ       Full                                                      +---------+---------------+---------+-----------+----------+-------+  FV Prox   Full                                                      +---------+---------------+---------+-----------+----------+-------+  FV Mid    Full                                                      +---------+---------------+---------+-----------+----------+-------+  FV Distal Full                                                      +---------+---------------+---------+-----------+----------+-------+  PFV       Full                                                      +---------+---------------+---------+-----------+----------+-------+  POP       Full            Yes       Yes                              +---------+---------------+---------+-----------+----------+-------+  PTV       Full                                                      +---------+---------------+---------+-----------+----------+-------+  PERO      Full                                                      +---------+---------------+---------+-----------+----------+-------+  GSV       Full                                                      +---------+---------------+---------+-----------+----------+-------+     Summary: Right: There is no evidence of deep vein thrombosis in the lower extremity. There is no evidence of superficial venous thrombosis. Left: No evidence of deep vein thrombosis in the lower extremity. There is no evidence of superficial venous thrombosis.  *See table(s) above for measurements and observations. Electronically signed by Ruta Hinds MD on 09/21/2018 at 4:04:03 PM.    Final     Microbiology: Recent Results (from the past 240 hour(s))  SARS CORONAVIRUS 2 Nasal Swab Aptima Multi Swab     Status: None   Collection Time: 09/19/18 11:28 PM   Specimen: Aptima Multi Swab; Nasal Swab  Result Value Ref Range Status   SARS Coronavirus 2 NEGATIVE NEGATIVE Final    Comment: (NOTE) SARS-CoV-2 target nucleic acids are NOT DETECTED. The SARS-CoV-2 RNA is generally detectable in upper and lower respiratory specimens during the acute phase of infection. Negative results do not preclude SARS-CoV-2 infection, do not rule out co-infections with other pathogens, and should not be used as the sole basis for treatment or other patient management decisions. Negative results must be combined with clinical observations, patient history, and epidemiological information. The expected result is Negative. Fact Sheet for Patients: SugarRoll.be Fact Sheet for Healthcare Providers: https://www.woods-mathews.com/ This test is not yet approved or cleared by the [place] FDA and  has  been authorized for  detection and/or diagnosis of SARS-CoV-2 by FDA under an Emergency Use Authorization (EUA). This EUA will remain  in effect (meaning this test can be used) for the duration of the COVID-19 declaration under Section 56 4(b)(1) of the Act, 21 U.S.C. section 360bbb-3(b)(1), unless the authorization is terminated or revoked sooner. Performed at [facility], [place] [address], [place], [place] [address]   Body fluid culture     Status: None   Collection Time: 09/21/18 11:26 AM   Specimen: Ankle; Body Fluid  Result Value Ref Range Status   Specimen Description ANKLE  Final   Special Requests NONE  Final   Gram Stain   Final    FEW WBC PRESENT, PREDOMINANTLY PMN NO ORGANISMS SEEN    Culture   Final    NO GROWTH 3 DAYS Performed at [facility], [address] [place], [place] [address]    Report Status 09/24/2018 FINAL  Final     Labs: Basic Metabolic Panel: Recent Labs  Lab 09/21/18 0619 09/22/18 0635 09/23/18 0513 09/24/18 0454 09/25/18 0421  NA 137 136 134* 135 135  K 3.5 3.4* 3.6 4.0 3.4*  CL 101 101 98 95* 94*  CO2 _0 GLUCOSE 89 90 128* 113* 141*  BUN 27* 34* 36* 42* 43*  CREATININE 2.15* 2.24* 2.34* 2.18* 2.37*  CALCIUM 9.7 9.7 9.6 10.6* 11.0*  MG 1.6* 2.0 1.9  --   --   PHOS 3.0 2.7 2.8  --   --    Liver Function Tests: Recent Labs  Lab 09/20/18 0024 09/21/18 0619 09/22/18 0635 09/23/18 0513  AST 13* 11* 13* 16  ALT _1 ALKPHOS 97 81 91 100  BILITOT 0.8 1.2 1.2 0.9  PROT 7.4 7.1 7.0 7.1  ALBUMIN 3.6 3.0* 2.8* 2.8*   No results for input(s): LIPASE, AMYLASE in the last 168 hours. No results for input(s): AMMONIA in the last 168 hours. CBC: Recent Labs  Lab 09/20/18 0024 09/21/18 0619 09/22/18 0635 09/23/18 0513 09/24/18 0454  WBC 10.6* 9.4 9.5 9.6 9.8  NEUTROABS  --  6.9 7.0 7.1 6.8  HGB 12.7* 12.4* 11.8* 12.2* 13.0  HCT 39.0 37.9* 35.7* 36.5* 39.4  MCV 96.3 95.5 95.5 93.4 95.2  PLT 244 237  218 271 308   Cardiac Enzymes: No results for input(s): CKTOTAL, CKMB, CKMBINDEX, TROPONINI in the last 168 hours. BNP: BNP (last 3 results) No results for input(s): BNP in the last 8760 hours.  ProBNP (last 3 results) No results for input(s): PROBNP in the last 8760 hours.  CBG: Recent Labs  Lab 09/24/18 1129 09/24/18 1614 09/24/18 2155 09/25/18 0702 09/25/18 1105  GLUCAP 108* 143* 116* 136* 142*       Signed:  Alma Friendly, MD [facility] 09/25/2018, 3:35 PM

## 2018-09-25 NOTE — Progress Notes (Signed)
Marc Owen to be discharged home per MD order. Discussed prescriptions and follow up appointments with the patient. Prescriptions given to patient; medication list explained in detail. Patient verbalized understanding.  Skin clean, dry and intact without evidence of skin break down, no evidence of skin tears noted. IV catheter discontinued intact. Site without signs and symptoms of complications. Dressing and pressure applied. Pt denies pain at the site currently. No complaints noted.  Patient free of lines, drains, and wounds.   An After Visit Summary (AVS) was printed and given to the patient. Patient escorted via wheelchair, and discharged home via private auto.  Baldo Ash, RN

## 2018-09-25 NOTE — Progress Notes (Signed)
Patient sated while being evaluated for home O2 94% on room air without difficulty.   Farley Ly RN

## 2018-09-25 NOTE — Plan of Care (Signed)
  Problem: Health Behavior/Discharge Planning: Goal: Ability to manage health-related needs will improve Outcome: Progressing   Problem: Clinical Measurements: Goal: Ability to maintain clinical measurements within normal limits will improve Outcome: Progressing   

## 2018-09-25 NOTE — Progress Notes (Signed)
ANTICOAGULATION CONSULT NOTE - Follow-Up Consult  Pharmacy Consult for Warfarin Indication: atrial fibrillation  Not on File  Patient Measurements: Height: 5\' 7"  (170.2 cm) Weight: 218 lb 7.6 oz (99.1 kg) IBW/kg (Calculated) : 66.1  Vital Signs: Temp: 97.9 F (36.6 C) (08/07 0410) Temp Source: Oral (08/07 0410) BP: 112/74 (08/07 0410) Pulse Rate: 74 (08/07 0807)  Labs: Recent Labs    09/23/18 0513 09/24/18 0454 09/25/18 0421  HGB 12.2* 13.0  --   HCT 36.5* 39.4  --   PLT 271 308  --   LABPROT 27.3* 24.7* 21.6*  INR 2.6* 2.3* 1.9*  CREATININE 2.34* 2.18* 2.37*    Estimated Creatinine Clearance: 31.6 mL/min (A) (by C-G formula based on SCr of 2.37 mg/dL (H)).   Medical History: History reviewed. No pertinent past medical history.  Assessment: 28 YOM who presented on 8/1 with R-foot swelling/pain. The patient was on warfarin PTA for hx Afib. PTA dose was 2.5 mg daily EXCEPT for 5 mg on Mon/Fri. Warfarin held initially due to concern for a need for interventions, resumed 8/3 evening s/p aspiration by ortho. Pharmacy consulted to dose.   INR today is slightly SUBtherapeutic (INR 1.9 << 2.3). No CBC today, stable from 8/7 labs. No bleeding reported.   Goal of Therapy:  INR 2-3 Monitor platelets by anticoagulation protocol: Yes   Plan:  - Warfarin 5 mg x 1 dose at 1800 today - Daily PT/INR, CBC q72h - Will continue to monitor for any signs/symptoms of bleeding and will follow up with PT/INR in the a.m.   Thank you for allowing pharmacy to be a part of this patient's care.  Alycia Rossetti, PharmD, BCPS Clinical Pharmacist Clinical phone for 09/25/2018: [phone] 09/25/2018 8:55 AM   **Pharmacist phone directory can now be found on [email or web address] ([email or web address]).  Listed under [place].

## 2020-03-13 IMAGING — DX PORTABLE CHEST - 1 VIEW
1 series · 1 of 1 positions shown · non-contrast
Comparison: none

CLINICAL DATA: Shortness of breath.  07/08/2017.

EXAM:
PORTABLE CHEST 1 VIEW

[chest ap]
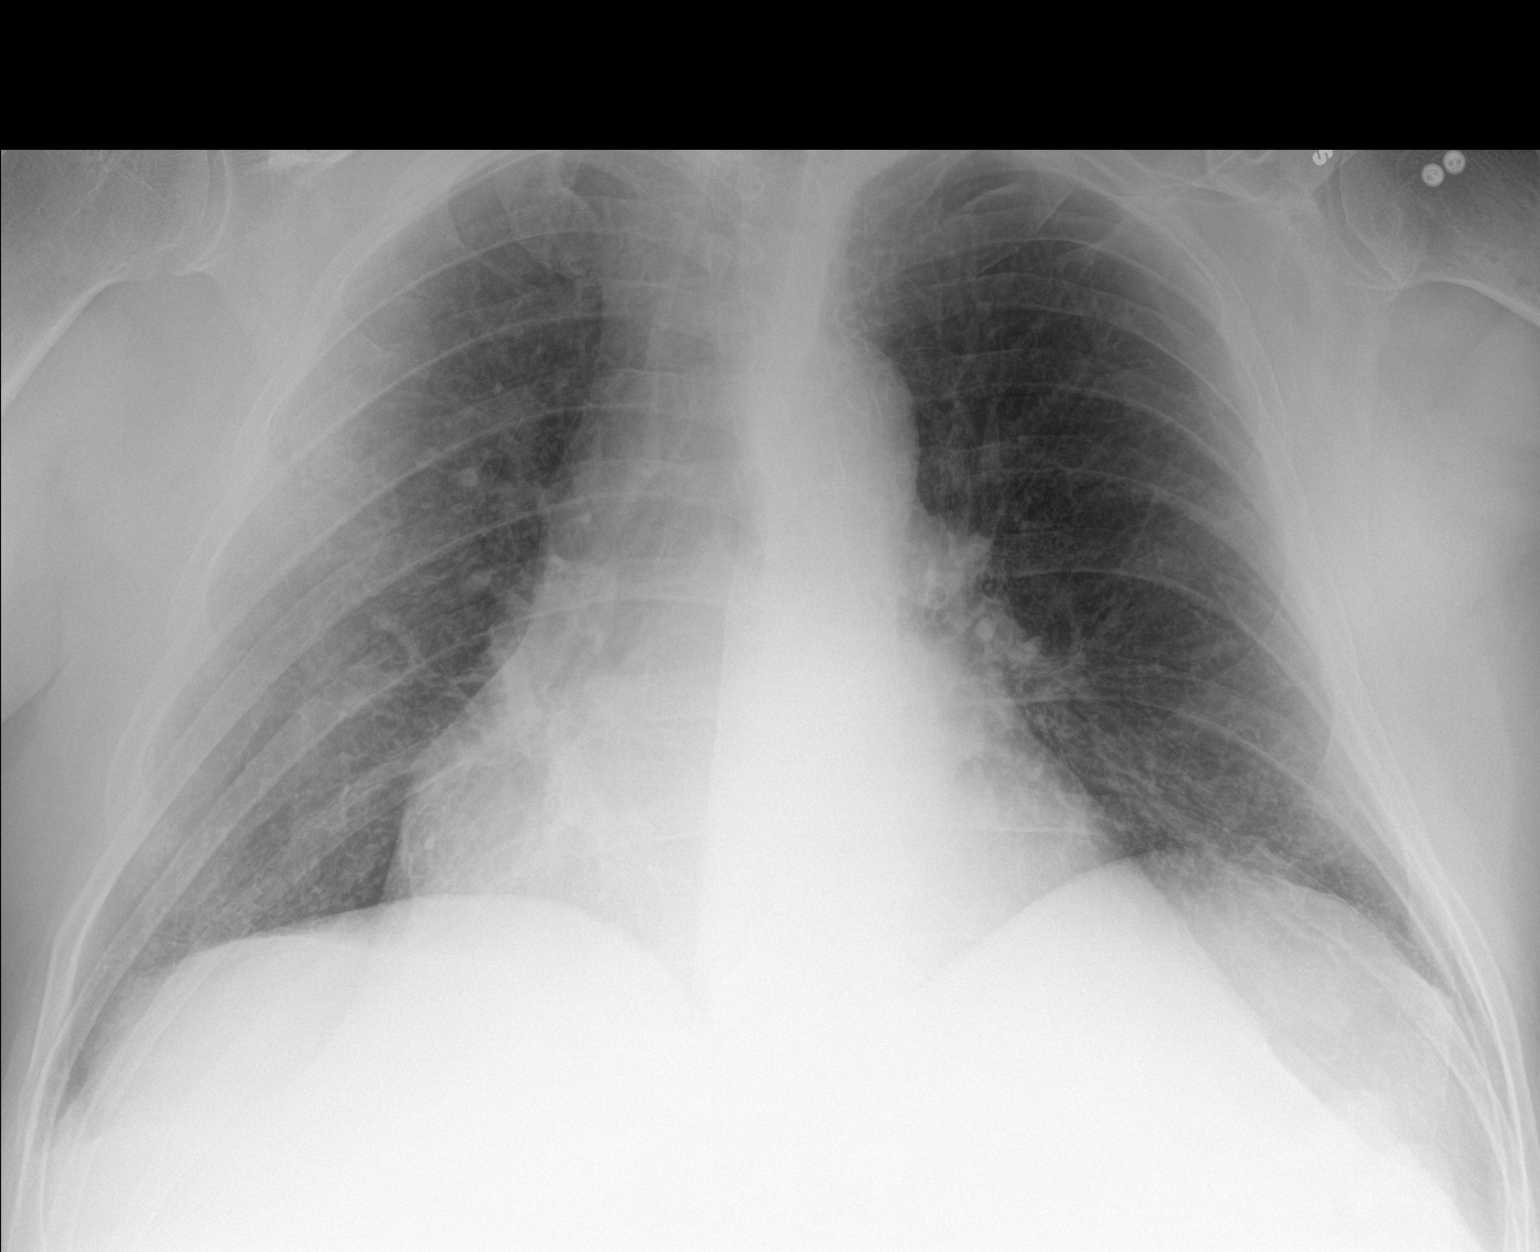

[1 of 1 positions shown; findings below may reference images not displayed]

FINDINGS: Mediastinum and hilar structures normal. Stable cardiomegaly. Low
lung. Mild left base subsegmental axis and or scarring. Stable prior
exam. Filtrate noted. No pleural effusion or pneumothorax.
IMPRESSION: 1. Cardiomegaly. No pulmonary venous congestion. 2. Low lung
volumes. Mild left base subsegmental atelectasis/scarring, no change
from prior exam. No acute pulmonary infiltrate noted.
